# Patient Record
Sex: Male | Born: 1960 | Race: White | Hispanic: No | Smoking: Former smoker
Health system: Southern US, Community
[De-identification: ages and names within clinical notes are randomized; demographics above are authoritative.]

## PROBLEM LIST (undated history)

## (undated) DIAGNOSIS — T7840XA Allergy, unspecified, initial encounter: Secondary | ICD-10-CM

## (undated) DIAGNOSIS — I10 Essential (primary) hypertension: Secondary | ICD-10-CM

## (undated) DIAGNOSIS — F319 Bipolar disorder, unspecified: Secondary | ICD-10-CM

## (undated) DIAGNOSIS — R52 Pain, unspecified: Secondary | ICD-10-CM

## (undated) DIAGNOSIS — K219 Gastro-esophageal reflux disease without esophagitis: Secondary | ICD-10-CM

## (undated) DIAGNOSIS — I639 Cerebral infarction, unspecified: Secondary | ICD-10-CM

## (undated) DIAGNOSIS — F988 Other specified behavioral and emotional disorders with onset usually occurring in childhood and adolescence: Secondary | ICD-10-CM

## (undated) DIAGNOSIS — F32A Depression, unspecified: Secondary | ICD-10-CM

## (undated) DIAGNOSIS — M1990 Unspecified osteoarthritis, unspecified site: Secondary | ICD-10-CM

## (undated) DIAGNOSIS — F329 Major depressive disorder, single episode, unspecified: Secondary | ICD-10-CM

## (undated) DIAGNOSIS — G4730 Sleep apnea, unspecified: Secondary | ICD-10-CM

## (undated) DIAGNOSIS — F419 Anxiety disorder, unspecified: Secondary | ICD-10-CM

## (undated) DIAGNOSIS — I619 Nontraumatic intracerebral hemorrhage, unspecified: Secondary | ICD-10-CM

## (undated) DIAGNOSIS — N200 Calculus of kidney: Secondary | ICD-10-CM

## (undated) HISTORY — DX: Calculus of kidney: N20.0

## (undated) HISTORY — DX: Essential (primary) hypertension: I10

## (undated) HISTORY — DX: Cerebral infarction, unspecified: I63.9

## (undated) HISTORY — PX: COLONOSCOPY: SHX174

## (undated) HISTORY — PX: HERNIA REPAIR: SHX51

## (undated) HISTORY — DX: Allergy, unspecified, initial encounter: T78.40XA

---

## 2007-06-18 ENCOUNTER — Ambulatory Visit: Payer: Self-pay | Admitting: Specialist

## 2009-08-06 ENCOUNTER — Ambulatory Visit: Payer: Self-pay | Admitting: Gastroenterology

## 2010-11-01 ENCOUNTER — Ambulatory Visit: Payer: Self-pay | Admitting: Surgery

## 2011-10-10 DIAGNOSIS — F419 Anxiety disorder, unspecified: Secondary | ICD-10-CM | POA: Insufficient documentation

## 2011-10-10 DIAGNOSIS — I10 Essential (primary) hypertension: Secondary | ICD-10-CM | POA: Insufficient documentation

## 2011-10-10 DIAGNOSIS — J309 Allergic rhinitis, unspecified: Secondary | ICD-10-CM | POA: Insufficient documentation

## 2011-10-10 DIAGNOSIS — Z7251 High risk heterosexual behavior: Secondary | ICD-10-CM | POA: Insufficient documentation

## 2011-10-10 DIAGNOSIS — K219 Gastro-esophageal reflux disease without esophagitis: Secondary | ICD-10-CM | POA: Insufficient documentation

## 2011-10-10 DIAGNOSIS — F319 Bipolar disorder, unspecified: Secondary | ICD-10-CM | POA: Insufficient documentation

## 2012-10-26 DIAGNOSIS — I639 Cerebral infarction, unspecified: Secondary | ICD-10-CM | POA: Insufficient documentation

## 2012-10-31 ENCOUNTER — Inpatient Hospital Stay (HOSPITAL_COMMUNITY)
Admission: EM | Admit: 2012-10-31 | Discharge: 2012-11-02 | DRG: 810 | Disposition: A | Discharge status: Home / Self Care | Payer: BC Managed Care – PPO | Attending: Neurology | Admitting: Neurology

## 2012-10-31 ENCOUNTER — Emergency Department (HOSPITAL_COMMUNITY): Payer: BC Managed Care – PPO

## 2012-10-31 ENCOUNTER — Encounter (HOSPITAL_COMMUNITY): Payer: Self-pay | Admitting: Emergency Medicine

## 2012-10-31 DIAGNOSIS — Z800 Family history of malignant neoplasm of digestive organs: Secondary | ICD-10-CM

## 2012-10-31 DIAGNOSIS — F1920 Other psychoactive substance dependence, uncomplicated: Secondary | ICD-10-CM

## 2012-10-31 DIAGNOSIS — Z6830 Body mass index (BMI) 30.0-30.9, adult: Secondary | ICD-10-CM

## 2012-10-31 DIAGNOSIS — H53149 Visual discomfort, unspecified: Secondary | ICD-10-CM | POA: Diagnosis present

## 2012-10-31 DIAGNOSIS — I619 Nontraumatic intracerebral hemorrhage, unspecified: Secondary | ICD-10-CM | POA: Diagnosis present

## 2012-10-31 DIAGNOSIS — F17200 Nicotine dependence, unspecified, uncomplicated: Secondary | ICD-10-CM | POA: Diagnosis present

## 2012-10-31 DIAGNOSIS — G441 Vascular headache, not elsewhere classified: Secondary | ICD-10-CM

## 2012-10-31 DIAGNOSIS — I10 Essential (primary) hypertension: Secondary | ICD-10-CM | POA: Diagnosis present

## 2012-10-31 DIAGNOSIS — Z7982 Long term (current) use of aspirin: Secondary | ICD-10-CM

## 2012-10-31 DIAGNOSIS — F1010 Alcohol abuse, uncomplicated: Secondary | ICD-10-CM | POA: Diagnosis present

## 2012-10-31 DIAGNOSIS — F1120 Opioid dependence, uncomplicated: Secondary | ICD-10-CM | POA: Diagnosis present

## 2012-10-31 DIAGNOSIS — I609 Nontraumatic subarachnoid hemorrhage, unspecified: Principal | ICD-10-CM | POA: Diagnosis present

## 2012-10-31 DIAGNOSIS — I639 Cerebral infarction, unspecified: Secondary | ICD-10-CM

## 2012-10-31 DIAGNOSIS — I611 Nontraumatic intracerebral hemorrhage in hemisphere, cortical: Secondary | ICD-10-CM

## 2012-10-31 DIAGNOSIS — I498 Other specified cardiac arrhythmias: Secondary | ICD-10-CM | POA: Diagnosis present

## 2012-10-31 DIAGNOSIS — F411 Generalized anxiety disorder: Secondary | ICD-10-CM | POA: Diagnosis present

## 2012-10-31 HISTORY — DX: Anxiety disorder, unspecified: F41.9

## 2012-10-31 HISTORY — DX: Nontraumatic intracerebral hemorrhage, unspecified: I61.9

## 2012-10-31 LAB — COMPREHENSIVE METABOLIC PANEL
ALT: 26 U/L (ref 0–53)
Calcium: 9.1 mg/dL (ref 8.4–10.5)
Creatinine, Ser: 0.97 mg/dL (ref 0.50–1.35)
GFR calc Af Amer: >90 mL/min (ref >90)
Glucose, Bld: 119 mg/dL — ABNORMAL HIGH (ref 70–99)
Sodium: 134 mEq/L — ABNORMAL LOW (ref 135–145)
Total Protein: 7.2 g/dL (ref 6.0–8.3)

## 2012-10-31 LAB — PROTIME-INR
INR: 0.94 (ref 0.00–1.49)
Prothrombin Time: 12.4 seconds (ref 11.6–15.2)

## 2012-10-31 LAB — TYPE AND SCREEN
ABO/RH(D): A POS
Antibody Screen: NEGATIVE

## 2012-10-31 LAB — CBC
Hemoglobin: 15.2 g/dL (ref 13.0–17.0)
MCH: 29.6 pg (ref 26.0–34.0)
MCHC: 33.9 g/dL (ref 30.0–36.0)

## 2012-10-31 LAB — MRSA PCR SCREENING: MRSA by PCR: NEGATIVE

## 2012-10-31 MED ORDER — ACETAMINOPHEN 650 MG RE SUPP: 650.0000 mg | RECTAL | Status: DC | PRN

## 2012-10-31 MED ORDER — SODIUM CHLORIDE 0.9 % IV SOLN
INTRAVENOUS | Status: DC
  Administered 2012-10-31: 15:00:00 via INTRAVENOUS

## 2012-10-31 MED ORDER — IOHEXOL 350 MG/ML SOLN
100.0000 mL | Freq: Once | INTRAVENOUS | Status: AC | PRN
  Administered 2012-10-31: 100 mL via INTRAVENOUS

## 2012-10-31 MED ORDER — PANTOPRAZOLE SODIUM 40 MG PO TBEC
40.0000 mg | DELAYED_RELEASE_TABLET | Freq: Every day | ORAL | Status: DC
  Administered 2012-11-01 – 2012-11-02 (×2): 40 mg via ORAL
  Filled 2012-10-31 (×2): qty 1

## 2012-10-31 MED ORDER — ONDANSETRON HCL 4 MG/2ML IJ SOLN
4.0000 mg | Freq: Three times a day (TID) | INTRAMUSCULAR | Status: DC | PRN
  Administered 2012-10-31: 4 mg via INTRAVENOUS
  Filled 2012-10-31: qty 2

## 2012-10-31 MED ORDER — MORPHINE SULFATE 2 MG/ML IJ SOLN
2.0000 mg | INTRAMUSCULAR | Status: DC | PRN
  Administered 2012-10-31 – 2012-11-02 (×8): 2 mg via INTRAVENOUS
  Filled 2012-10-31 (×8): qty 1

## 2012-10-31 MED ORDER — PANTOPRAZOLE SODIUM 40 MG IV SOLR: 40.0000 mg | Freq: Every day | INTRAVENOUS | Status: DC

## 2012-10-31 MED ORDER — LORAZEPAM 2 MG/ML IJ SOLN: 0.5000 mg | Freq: Once | INTRAMUSCULAR | Status: AC

## 2012-10-31 MED ORDER — SENNOSIDES-DOCUSATE SODIUM 8.6-50 MG PO TABS
1.0000 | ORAL_TABLET | Freq: Two times a day (BID) | ORAL | Status: DC
  Administered 2012-11-01 – 2012-11-02 (×3): 1 via ORAL
  Filled 2012-10-31 (×5): qty 1

## 2012-10-31 MED ORDER — PANTOPRAZOLE SODIUM 40 MG PO TBEC: 40.0000 mg | DELAYED_RELEASE_TABLET | Freq: Every day | ORAL | Status: DC

## 2012-10-31 MED ORDER — LORAZEPAM 2 MG/ML IJ SOLN
0.5000 mg | Freq: Three times a day (TID) | INTRAMUSCULAR | Status: DC
  Administered 2012-11-01: 0.5 mg via INTRAMUSCULAR
  Filled 2012-10-31: qty 1

## 2012-10-31 MED ORDER — ACETAMINOPHEN 325 MG PO TABS
650.0000 mg | ORAL_TABLET | ORAL | Status: DC | PRN
  Administered 2012-11-01 – 2012-11-02 (×4): 650 mg via ORAL
  Filled 2012-10-31 (×4): qty 2

## 2012-10-31 MED ORDER — LORAZEPAM 2 MG/ML IJ SOLN
INTRAMUSCULAR | Status: AC
  Administered 2012-10-31: 0.5 mg via INTRAVENOUS
  Filled 2012-10-31: qty 1

## 2012-10-31 MED ORDER — PROMETHAZINE HCL 25 MG/ML IJ SOLN
25.0000 mg | Freq: Once | INTRAMUSCULAR | Status: AC
  Administered 2012-10-31: 25 mg via INTRAVENOUS
  Filled 2012-10-31: qty 1

## 2012-10-31 MED ORDER — ESCITALOPRAM OXALATE 10 MG PO TABS
10.0000 mg | ORAL_TABLET | Freq: Every day | ORAL | Status: DC
  Administered 2012-11-01 – 2012-11-02 (×2): 10 mg via ORAL
  Filled 2012-10-31 (×3): qty 1

## 2012-10-31 MED ORDER — HYDROMORPHONE HCL PF 1 MG/ML IJ SOLN
0.5000 mg | INTRAMUSCULAR | Status: DC | PRN
  Administered 2012-10-31 (×2): 0.5 mg via INTRAVENOUS
  Filled 2012-10-31: qty 1

## 2012-10-31 MED ORDER — MORPHINE SULFATE 2 MG/ML IJ SOLN
INTRAMUSCULAR | Status: AC
  Administered 2012-10-31: 2 mg via INTRAVENOUS
  Filled 2012-10-31: qty 1

## 2012-10-31 MED ORDER — LABETALOL HCL 5 MG/ML IV SOLN: 10.0000 mg | INTRAVENOUS | Status: DC | PRN

## 2012-10-31 MED ORDER — HYDROMORPHONE HCL PF 1 MG/ML IJ SOLN: 1.0000 mg | Freq: Once | INTRAMUSCULAR | Status: DC

## 2012-10-31 MED ORDER — HYDROMORPHONE HCL PF 1 MG/ML IJ SOLN
1.0000 mg | Freq: Once | INTRAMUSCULAR | Status: AC
  Administered 2012-10-31: 1 mg via INTRAVENOUS
  Filled 2012-10-31: qty 1

## 2012-10-31 MED ORDER — HYDROMORPHONE HCL PF 1 MG/ML IJ SOLN
1.0000 mg | INTRAMUSCULAR | Status: DC | PRN
  Filled 2012-10-31: qty 1

## 2012-10-31 MED ORDER — SODIUM CHLORIDE 0.9 % IV SOLN: INTRAVENOUS | Status: AC

## 2012-10-31 NOTE — ED Notes (Signed)
Patient had emesis.

## 2012-10-31 NOTE — ED Notes (Signed)
Patient has been  complaining of being hungry. Informed that he cannot have anything. Remains alert and oriented and follows commands. Pupils have been equal and reactive

## 2012-10-31 NOTE — ED Notes (Signed)
Pupils equal and reactive

## 2012-10-31 NOTE — H&P (Signed)
Admission H&P    Chief Complaint: Severe headache HPI: Roger [name] is an 52 y.o. male who developed a severe headache Tuesday night. He states the headache has been constant since then and has been associated with photophobia as well as nausea. The patient has been taking opioid pain medications without relief. He did not seek medical attention until today when he presented to the emergency department at [facility]. 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 There was a small amount of associated subarachnoid hemorrhage as well. A CT angiogram was also performed which showed no evidence of arteriovenous malformation although there was possibly a venous anomaly or venous angioma. The patient was transferred to [facility] to be admitted to the 3100 unit for further evaluation and treatment. He was reported to that a neurosurgeon at [facility] reviewed the patient's images and did not feel that surgery would be indicated. I do not have any actual documentation of a neurosurgery consult. The patient denied any associated visual changes, unilateral weakness, speech difficulties, or syncope.  The patient states that he was seen by his primary care physician Dr. Leavy Cella in [place] approximately 3 weeks ago. At that time the patient was told his blood pressure was high. Dr. Leavy Cella recommended treatment with an antihypertensive medication although the patient declined.  Past Medical History  Diagnosis Date  . Anxiety     Past Surgical History  Procedure Laterality Date  . Hernia repair    . Cholecystectomy      Family History: The patient denies any family history of strokes. Both of his parents are in their 68s. His father is alive and well. His mother is dying from pancreatic cancer.   Social History:  reports that he has been smoking approximately 1/2 pack cigarettes per day for 10 years.  He does not  have any smokeless tobacco history on file. He reports that  drinks alcohol. He reports that he uses illicit drugs including cocaine although he has not used any recently. The patient works as a Scientist, research (medical).  Allergies: No Known Allergies  Medications: Medications Prior to Admission  Medication Sig Dispense Refill  . ALPRAZolam (XANAX) 1 MG tablet Take 0.5 mg by mouth 2 (two) times daily as needed for sleep or anxiety.       Marland Kitchen aspirin-acetaminophen-caffeine (EXCEDRIN MIGRAINE) 250-250-65 MG per tablet Take 1 tablet by mouth every 8 (eight) hours as needed (for migraine).      . butalbital-acetaminophen-caffeine (FIORICET, ESGIC) 50-325-40 MG per tablet Take 1 tablet by mouth once as needed for headache.      . calcium carbonate (TUMS - DOSED IN MG ELEMENTAL CALCIUM) 500 MG chewable tablet Chew 1 tablet by mouth daily as needed for heartburn.      . escitalopram (LEXAPRO) 10 MG tablet Take 10 mg by mouth daily.      Marland Kitchen ketorolac (TORADOL) 10 MG tablet Take 20 mg by mouth daily as needed for pain.      . Multiple Vitamins-Minerals (MULTIVITAMIN PO) Take 1 tablet by mouth daily.      Marland Kitchen omeprazole (PRILOSEC) 40 MG capsule Take 40 mg by mouth daily.      . traZODone (DESYREL) 50 MG tablet Take 50 mg by mouth at bedtime.      . naproxen sodium (ANAPROX) 220 MG tablet Take 440 mg by mouth 2 (two) times daily as needed (for pain).  ROS: Please see the history of present illness as noted above. The patient denies any other recent medical symptoms. He is somewhat uncooperative with the history at this time due to pain.  Physical Examination: Blood pressure 125/72, pulse 47, temperature 98.3 F (36.8 C), temperature source Oral, resp. rate 16, height 6' (1.829 m), weight 100.4 kg (221 lb 5.5 oz), SpO2 97.00%.  General Examination:     General - 52 year old male in bed holding his head complaining of a severe headache. Heart - Regular rate and rhythm - no murmer - bradycardia rate. Lungs -  Clear to auscultation Abdomen - Soft - non tender Extremities - Distal pulses intact - no edema Skin - Warm and dry  Mental Status: Alert, oriented, thought content appropriate.  Speech fluent without evidence of aphasia.  Able to follow 3 step commands without difficulty. Cranial Nerves: II: Discs unable to visualize; Visual fields grossly normal, pupils equal, round, reactive to light and accommodation III,IV, VI: ptosis not present, extra-ocular motions intact bilaterally V,VII: smile symmetric, facial light touch sensation normal bilaterally VIII: hearing normal bilaterally IX,X: gag reflex present XI: bilateral shoulder shrug XII: midline tongue extension Motor: Right : Upper extremity   5/5    Left:     Upper extremity   5/5  Lower extremity   5/5     Lower extremity   5/5 Tone and bulk:normal tone throughout; no atrophy noted Sensory: Light touch intact throughout, bilaterally Deep Tendon Reflexes: 2+ and symmetric throughout Plantars: Right: downgoing   Left: downgoing Cerebellar: normal finger-to-nose, normal rapid alternating movements  Gait: Did not attempt to ambulate the patient due to safety concerns. CV: pulses palpable throughout   Laboratory Studies: Basic Metabolic Panel:  Recent Labs Lab 10/31/12 1400  NA 134*  K 4.2  CL 101  CO2 25  GLUCOSE 119*  BUN 10  CREATININE 0.97  CALCIUM 9.1    Liver Function Tests:  Recent Labs Lab 10/31/12 1400  AST 18  ALT 26  ALKPHOS 53  BILITOT 0.4  PROT 7.2  ALBUMIN 3.8   No results found for this basename: LIPASE, AMYLASE,  in the last 168 hours No results found for this basename: AMMONIA,  in the last 168 hours  CBC:  Recent Labs Lab 10/31/12 1400  WBC 13.3*  HGB 15.2  HCT 44.9  MCV 87.5  PLT 199    Cardiac Enzymes: No results found for this basename: CKTOTAL, CKMB, CKMBINDEX, TROPONINI,  in the last 168 hours  BNP: No components found with this basename: POCBNP,   CBG: No results found  for this basename: GLUCAP,  in the last 168 hours  Microbiology: No results found for this or any previous visit.  Coagulation Studies:  Recent Labs  10/31/12 1400  LABPROT 12.4  INR 0.94    Urinalysis: No results found for this basename: COLORURINE, APPERANCEUR, LABSPEC, PHURINE, GLUCOSEU, HGBUR, BILIRUBINUR, KETONESUR, PROTEINUR, UROBILINOGEN, NITRITE, LEUKOCYTESUR,  in the last 168 hours  Lipid Panel: No results found for this basename: chol, trig, hdl, cholhdl, vldl, ldlcalc    HgbA1C: No results found for this basename: HGBA1C    Urine Drug Screen:   No results found for this basename: labopia, cocainscrnur, labbenz, amphetmu, thcu, labbarb     Alcohol Level: No results found for this basename: ETH,  in the last 168 hours  Other results: EKG: An EKG currently not available. We will order one at this time.  Imaging:  Ct Angio Head W/cm &/or Wo Cm 10/31/2012  No evidence of high flow arteriovenous malformation.  Grouping of slightly prominent spider-like vessels along the lateral margin of the right frontal hemorrhage raising the possibility of a venous anomaly/venous angioma.  I do not believe this is absolutely certain however.    Ct Head Wo Contrast 10/31/2012  3.2 x 2.4 x 3.0 cm intraparenchymal hemorrhage in the right frontal lobe.  There is a small amount of associated subarachnoid hemorrhage.  I called this report to the in the emergency room health care provider. .    Assessment/Plan This is an unfortunate 52 year old male admitted to to the neuro intensive care unit with an intraparenchymal hemorrhage in the right frontal lobe with subarachnoid extension. The patient continues to have severe pain despite intravenous dilaudid.. As discussed with Dr. Roseanne Reno we will try intravenous morphine for pain. The patient also has a history of an anxiety disorder. We will place him on scheduled Ativan for the time being. We will plan on repeating a CT scan in the morning  for further evaluation. The patient is also noted to be bradycardic despite his pain. He denies any previous history of bradycardia. We will check an EKG and closely monitor his heart rate.  Delton See PA-C [facility] Pager [phone] 10/31/2012, 5:46 PM  I personally participated in this patient's evaluation and management including bedside evaluation, as well as formulation of the above clinical assessment and management recommendations.  Venetia Maxon M.D. [facility] [phone]

## 2012-10-31 NOTE — ED Notes (Signed)
Pt c/o r side headache x5 days.  Denies hx of migraines, LOC, confusion, or blurred vision.  Reports taking multiple pain medications with no relief.

## 2012-10-31 NOTE — ED Provider Notes (Signed)
History    CSN: [id number] Arrival date & time 10/31/12  1048  First MD Initiated Contact with Patient 10/31/12 1213     Chief Complaint  Patient presents with  . Migraine   (Consider location/radiation/quality/duration/timing/severity/associated sxs/prior Treatment) HPI Pt is a 52yo male presenting with gradual onset of new headache that started on Tuesday 6/24, 10/10, right side of his head associated with photophobia, phonophobia, and nausea.  Has tried "multiple" OTC medications w/o relief.  Denies hx of migraines, LOC, confusion, or blurred vision.  Denies any significant medical hx.  Does not take any daily medications. Denies head trauma.  Past Medical History  Diagnosis Date  . Anxiety    Past Surgical History  Procedure Laterality Date  . Hernia repair    . Cholecystectomy     No family history on file. History  Substance Use Topics  . Smoking status: Current Every Day Smoker  . Smokeless tobacco: Not on file  . Alcohol Use: Yes    Review of Systems  Constitutional: Negative for fever, chills and fatigue.  Eyes: Positive for photophobia. Negative for pain, redness and visual disturbance.  Gastrointestinal: Positive for nausea.  Neurological: Positive for headaches. Negative for dizziness and light-headedness.  All other systems reviewed and are negative.    Allergies  Review of patient's allergies indicates no known allergies.  Home Medications   No current outpatient prescriptions on file. BP 125/72  Pulse 47  Temp(Src) 98.3 F (36.8 C) (Oral)  Resp 16  Ht 6' (1.829 m)  Wt 221 lb 5.5 oz (100.4 kg)  BMI 30.01 kg/m2  SpO2 97% Physical Exam  Nursing note and vitals reviewed. Constitutional: He is oriented to person, place, and time. He appears well-developed and well-nourished.  Pt lying on exam bed with hands and pillow over right side of his head, lights turned out. Appears in moderate discomfort due to head pain.   HENT:  Head: Normocephalic  and atraumatic.  Eyes: Conjunctivae are normal. No scleral icterus.  Neck: Normal range of motion. Neck supple.  No nuchal rigidity or meningeal signs.    Cardiovascular: Normal rate, regular rhythm and normal heart sounds.   Pulmonary/Chest: Effort normal and breath sounds normal.  Musculoskeletal: Normal range of motion.  Neurological: He is alert and oriented to person, place, and time. He has normal strength. No cranial nerve deficit or sensory deficit. Coordination normal. GCS eye subscore is 4. GCS verbal subscore is 5. GCS motor subscore is 6.  CN II-XII in tact, no focal deficit, nl finger to nose coordination. Nl sensation, 5/5 strength in all major muscle groups. Did not have pt stand due to severe pain and discomfort.   Skin: Skin is warm and dry. He is not diaphoretic.    ED Course  Procedures (including critical care time) Labs Reviewed  CBC - Abnormal; Notable for the following:    WBC 13.3 (*)    All other components within normal limits  COMPREHENSIVE METABOLIC PANEL - Abnormal; Notable for the following:    Sodium 134 (*)    Glucose, Bld 119 (*)    All other components within normal limits  MRSA PCR SCREENING  PROTIME-INR  TYPE AND SCREEN  ABO/RH   Ct Angio Head W/cm &/or Wo Cm  10/31/2012   *RADIOLOGY REPORT*  Clinical Data:  Right frontal intraparenchymal hematoma.  CT ANGIOGRAPHY HEAD  Technique:  Multidetector CT imaging of the head was performed using the standard protocol during bolus administration of intravenous contrast.  Multiplanar  CT image reconstructions including MIPs were obtained to evaluate the vascular anatomy.  Contrast:   100 ml Omnipaque-300  Comparison:  Head CT same day  Findings:  There is no enlargement of the right frontal hematoma measuring 3.1 x 2.4 cm.  Surrounding edema appears the same.  No significant mass effect or shift.  There is no evidence of high flow arteriovenous malformation. Along the lateral margin of the right frontal  hemorrhage, there are some slightly prominent spider-like vessels which appear to be venous.  This could possibly indicate the presence of a low-level venous malformation, though I do not think we have established that with certainty.  This could be a luxury perfusion phenomenon.  Both internal carotid arteries are widely patent through the siphon regions.  No stenosis.  The anterior and middle cerebral vessels are normal without proximal stenosis or aneurysm.  Both vertebral arteries are patent to the basilar.  No basilar stenosis. Posterior circulation branch vessels are normal.  No evidence of venous thrombosis   Review of the MIP images confirms the above findings.  IMPRESSION: No evidence of high flow arteriovenous malformation.  Grouping of slightly prominent spider-like vessels along the lateral margin of the right frontal hemorrhage raising the possibility of a venous anomaly/venous angioma.  I do not believe this is absolutely certain however.   Original Report Authenticated By: Paulina Fusi, M.D.   Ct Head Wo Contrast  10/31/2012   *RADIOLOGY REPORT*  Clinical Data: Right-sided headache for 5 days.  CT HEAD WITHOUT CONTRAST  Technique:  Contiguous axial images were obtained from the base of the skull through the vertex without contrast.  Comparison: None.  Findings: There is a 3.2 x 2.4 x 3.0 cm intraparenchymal hemorrhage in the white matter of the right frontal lobe.  This hemorrhages surrounded by a small rim of edema and is not producing any mass effect.  There is a small amount of overlying subarachnoid hemorrhage.  There is no additional evidence of intracranial subdural  or subarachnoid hemorrhage.  There is no evidence of infarction or shift of midline structures.  The white and gray matter compartments are otherwise normal.  The ventricles and extra- axial spaces are normal.  The bony calvarium is intact.  The skull base is normal.  The mastoid air cells are clear.  Orbits and paranasal sinuses  are normal.  IMPRESSION: 3.2 x 2.4 x 3.0 cm intraparenchymal hemorrhage in the right frontal lobe.  There is a small amount of associated subarachnoid hemorrhage.  I called this report to the in the emergency room health care provider.   Original Report Authenticated By: Sander Radon, M.D.     Date: 10/31/2012  Rate: 66  Rhythm: junctional tachycardia  QRS Axis: indeterminate  Intervals: normal  ST/T Wave abnormalities: ST depressions inferiorly and borderline  Conduction Disutrbances:none  Narrative Interpretation:   Old EKG Reviewed: unchanged    1. Intraparenchymal hemorrhage of brain   2. Subarachnoid hemorrhage     MDM  Concern for intracranial bleed including SAH, brain mass, atypical migraine.  Pt does not have hx of migraines, denies head trauma, pt is overall healthy, denies use of blood thinners, source of severe HA unknown.  Will obtain CT head to help determine cause of pt's new onset headache.  Tx pain with Dilaudid and phenergan.   5:19 PM Informed by CT, pt has right frontal hematoma.  Will discuss with Dr. Denton Lank for further testing and consult with neurosurgery.  CT head: 3.2 x 2.4 x 3.0cm  intraparenchymal hemorrhage in right frontal lobe.  Small amount of associated SAH.    Consulted Dr. Dian Queen, neurosurgery who believes pt is appropriate for stroke service.  Consulted Dr. Elinor Parkinson who agreed to take over care of pt and have him transferred to [facility].   Junius Finner, PA-C 10/31/12 1719

## 2012-11-01 ENCOUNTER — Encounter (HOSPITAL_COMMUNITY): Payer: Self-pay | Admitting: Radiology

## 2012-11-01 ENCOUNTER — Inpatient Hospital Stay (HOSPITAL_COMMUNITY): Payer: BC Managed Care – PPO

## 2012-11-01 DIAGNOSIS — I6350 Cerebral infarction due to unspecified occlusion or stenosis of unspecified cerebral artery: Secondary | ICD-10-CM

## 2012-11-01 LAB — CBC
MCH: 29.8 pg (ref 26.0–34.0)
MCV: 87.6 fL (ref 78.0–100.0)
Platelets: 179 10*3/uL (ref 150–400)
RBC: 4.9 MIL/uL (ref 4.22–5.81)
RDW: 12.8 % (ref 11.5–15.5)

## 2012-11-01 LAB — RAPID URINE DRUG SCREEN, HOSP PERFORMED
Barbiturates: POSITIVE — AB
Benzodiazepines: POSITIVE — AB
Tetrahydrocannabinol: POSITIVE — AB

## 2012-11-01 LAB — HIV ANTIBODY (ROUTINE TESTING W REFLEX): HIV: NONREACTIVE

## 2012-11-01 LAB — BASIC METABOLIC PANEL
CO2: 27 mEq/L (ref 19–32)
Calcium: 8.5 mg/dL (ref 8.4–10.5)
Creatinine, Ser: 0.92 mg/dL (ref 0.50–1.35)

## 2012-11-01 MED ORDER — ONDANSETRON HCL 4 MG/2ML IJ SOLN
4.0000 mg | Freq: Four times a day (QID) | INTRAMUSCULAR | Status: DC | PRN
  Administered 2012-11-01 – 2012-11-02 (×2): 4 mg via INTRAVENOUS
  Filled 2012-11-01 (×2): qty 2

## 2012-11-01 MED ORDER — GADOBENATE DIMEGLUMINE 529 MG/ML IV SOLN
20.0000 mL | Freq: Once | INTRAVENOUS | Status: AC
  Administered 2012-11-01: 20 mL via INTRAVENOUS

## 2012-11-01 MED ORDER — LORAZEPAM 0.5 MG PO TABS
0.5000 mg | ORAL_TABLET | Freq: Two times a day (BID) | ORAL | Status: DC
  Administered 2012-11-01 – 2012-11-02 (×3): 0.5 mg via ORAL
  Filled 2012-11-01 (×3): qty 1

## 2012-11-01 MED ORDER — LORAZEPAM 0.5 MG PO TABS: 0.5000 mg | ORAL_TABLET | Freq: Two times a day (BID) | ORAL | Status: DC

## 2012-11-01 NOTE — ED Provider Notes (Signed)
Medical screening examination/treatment/procedure(s) were conducted as a shared visit with non-physician practitioner(s) and myself.  I personally evaluated the patient during the encounter Pt c/o acute onset diffuse headache 5 days ago, constant headache since onset. Ct w intraparencymal hem/small amt sah, discussed w Dr Gerlene Fee.  He request CTA and transfer to stroke service at [facility].  Discussed w Dr Roseanne Reno - he accepts in transfer to [facility].    Suzi Roots, MD 11/01/12 [phone]

## 2012-11-01 NOTE — Progress Notes (Signed)
Stroke Team Progress Note  HISTORY Roger [name] is an 52 y.o. male who developed a severe headache Tuesday night 10/26/2012. He states the headache has been constant since then and has been associated with photophobia as well as nausea. The patient has been taking opioid pain medications without relief. He did not seek medical attention until today 6/29/2014when he presented to the emergency department at [facility]. 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 There was a small amount of associated subarachnoid hemorrhage as well. A CT angiogram was also performed which showed no evidence of arteriovenous malformation although there was possibly a venous anomaly or venous angioma. The patient was transferred to [facility] to be admitted to the 3100 unit for further evaluation and treatment. He was reported to that a neurosurgeon at [facility] reviewed the patient's images and did not feel that surgery would be indicated. I do not have any actual documentation of a neurosurgery consult. The patient denied any associated visual changes, unilateral weakness, speech difficulties, or syncope.   The patient states that he was seen by his primary care physician Dr. Leavy Cella in [place] approximately 3 weeks ago. At that time the patient was told his blood pressure was high. Dr. Leavy Cella recommended treatment with an antihypertensive medication although the patient declined.  SUBJECTIVE His family is at the bedside.  Overall he feels his condition is stable. He complains of headache.  OBJECTIVE Most recent Vital Signs: Filed Vitals:   11/01/12 0400 11/01/12 0500 11/01/12 0600 11/01/12 0700  BP: 86/39 146/121 153/81 120/59  Pulse: 45 45 44 46  Temp:      TempSrc:      Resp: 22 21 22 16   Height:      Weight:      SpO2: 93% 95% 94% 96%   CBG (last 3)  No results found for this basename: GLUCAP,  in the last 72  hours  IV Fluid Intake:   . sodium chloride 10 mL/hr at 10/31/12 2000    MEDICATIONS  . sodium chloride   Intravenous STAT  . escitalopram  10 mg Oral Daily  .  HYDROmorphone (DILAUDID) injection  1 mg Intravenous Once  . LORazepam  0.5 mg Intramuscular Q8H  . pantoprazole  40 mg Oral Daily  . senna-docusate  1 tablet Oral BID   PRN:  acetaminophen, acetaminophen, labetalol, morphine injection  Diet:  Sodium Restricted thin liquids Activity:  Bedrest DVT Prophylaxis:  SCDs   CLINICALLY SIGNIFICANT STUDIES Basic Metabolic Panel:  Recent Labs Lab 10/31/12 1400 11/01/12 0530  NA 134* 134*  K 4.2 4.2  CL 101 101  CO2 25 27  GLUCOSE 119* 118*  BUN 10 12  CREATININE 0.97 0.92  CALCIUM 9.1 8.5   Liver Function Tests:  Recent Labs Lab 10/31/12 1400  AST 18  ALT 26  ALKPHOS 53  BILITOT 0.4  PROT 7.2  ALBUMIN 3.8   CBC:  Recent Labs Lab 10/31/12 1400 11/01/12 0530  WBC 13.3* 14.0*  HGB 15.2 14.6  HCT 44.9 42.9  MCV 87.5 87.6  PLT 199 179   Coagulation:  Recent Labs Lab 10/31/12 1400  LABPROT 12.4  INR 0.94   Cardiac Enzymes: No results found for this basename: CKTOTAL, CKMB, CKMBINDEX, TROPONINI,  in the last 168 hours Urinalysis: No results found for this basename: COLORURINE, APPERANCEUR, LABSPEC, PHURINE, GLUCOSEU, HGBUR, BILIRUBINUR, KETONESUR, PROTEINUR, UROBILINOGEN, NITRITE, LEUKOCYTESUR,  in the  last 168 hours Lipid Panel No results found for this basename: chol, trig, hdl, cholhdl, vldl, ldlcalc   HgbA1C  No results found for this basename: HGBA1C    Urine Drug Screen:   No results found for this basename: labopia, cocainscrnur, labbenz, amphetmu, thcu, labbarb    Alcohol Level: No results found for this basename: ETH,  in the last 168 hours  Ct Angio Head 10/31/2012  No evidence of high flow arteriovenous malformation.  Grouping of slightly prominent spider-like vessels along the lateral margin of the right frontal hemorrhage raising the  possibility of a venous anomaly/venous angioma.  I do not believe this is absolutely certain however.   CT of the brain   11/01/2012    1.  Stable appearance of the right anterior frontal lobe parenchymal hemorrhage with slight subarachnoid extension. 2.  No new hemorrhage or significant interval change.  10/31/2012   3.2 x 2.4 x 3.0 cm intraparenchymal hemorrhage in the right frontal lobe.  There is a small amount of associated subarachnoid hemorrhage.    MRI of the brain    MRA of the brain    2D Echocardiogram    Carotid Doppler    CXR    EKG     Therapy Recommendations   Physical Exam  General: The patient is alert and cooperative at the time of the examination.  Respiratory: Lung fields are clear.  Abdomen: Abdomen is soft, nontender, positive bowel sounds  Skin: No significant peripheral edema is noted.   Neurologic Exam  Cranial nerves: Facial symmetry is present. Speech is normal, no aphasia or dysarthria is noted. Extraocular movements are full. Visual fields are full.  Motor: The patient has good strength in all 4 extremities.  Coordination: The patient has good finger-nose-finger and heel-to-shin bilaterally.  Gait and station: The gait was not tested. No drift is seen.  Reflexes: Deep tendon reflexes are symmetric.     ASSESSMENT Mr. Roger [name] is a 52 y.o. male presenting with severe headache, photophobia and nausea. Imaging confirms a right frontal lobe IPH and SAH, ? Venous  anomoly. Etiology of hemorrhage uncertain at this point, BP not extremely elevated on admission. On aspirin sporatically prior to admission. Patient with resultant headache, no other neuro deficits. Work up underway.   Cigarette smoker, 1/2 ppd  etoh use, daily  Marijuana use  Hx cocaine use, none recent Hypertension, pt refused medications recommended by primary MD within the past 1 week  Hospital day # 1  TREATMENT/PLAN  Transfer to the floor  Change meds to  po  Check MRI with and without, carotid doppler, 2D echo  Check UDS  F/u HIV  OOB. Therapy evals  SBP goal < 180  SHARON BIBY, MSN, RN, ANVP-BC, ANP-BC, GNP-BC [facility] Pager: [phone] 11/01/2012 9:04 AM  I have personally obtained a history, examined the patient, evaluated imaging results, and formulated the assessment and plan of care. I agree with the above. Lesly Dukes

## 2012-11-01 NOTE — Progress Notes (Signed)
UR COMPLETED  

## 2012-11-01 NOTE — Evaluation (Signed)
Physical Therapy Evaluation Patient Details Name: Roger [name] MRN: [id number] DOB: 10-09-1960 Today's Date: 11/01/2012 Time: 4098-1191 PT Time Calculation (min): 12 min  PT Assessment / Plan / Recommendation History of Present Illness  Patient is a 52 y/o male admitted with severe headache and photophobia and nausea.  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 There was a small amount of associated subarachnoid hemorrhage as well.  Clinical Impression  Patient presents with decreased mobility limited by pain, decreased balance and limited activity tolerance and will benefit from skilled PT in the acute setting to allow return to independent.  Will initially need 24 hour assist upon d/c and will need further assessment to determine further discharge needs due to limited assessment with severe pain upon standing and walking in the room.    PT Assessment  Patient needs continued PT services    Follow Up Recommendations  Home health PT;Supervision/Assistance - 24 hour;No PT follow up (depending on progress)          Equipment Recommendations  Other (comment) (to be assessed)       Frequency Min 3X/week    Precautions / Restrictions Precautions Precautions: Fall   Pertinent Vitals/Pain 10/10 head pain with mobility      Mobility  Bed Mobility Bed Mobility: Right Sidelying to Sit;Sit to Supine Right Sidelying to Sit: 4: Min guard;HOB flat Sit to Supine: 4: Min guard;HOB flat Details for Bed Mobility Assistance: impulsively getting up due to frustration with immobility and pain and wanting to get up  Transfers Transfers: Sit to Stand;Stand to Sit Sit to Stand: 5: Supervision;From bed Stand to Sit: 5: Supervision;To bed Details for Transfer Assistance: holding head throughout Ambulation/Gait Ambulation/Gait Assistance: 4: Min assist Ambulation Distance (Feet): 4 Feet Ambulation/Gait Assistance Details: limited  mobility in room, pt attempted, but unable to tolerate due to increased head pain to 10/10 with attempted ambulation Gait Pattern: Decreased stride length;Step-through pattern;Trunk flexed;Wide base of support Modified Rankin (Stroke Patients Only) Pre-Morbid Rankin Score: No symptoms Modified Rankin: Moderately severe disability        PT Diagnosis: Difficulty walking;Acute pain  PT Problem List: Decreased mobility;Decreased balance;Decreased activity tolerance;Decreased safety awareness PT Treatment Interventions: DME instruction;Balance training;Gait training;Stair training;Functional mobility training;Therapeutic activities;Therapeutic exercise;Patient/family education     PT Goals(Current goals can be found in the care plan section) Acute Rehab PT Goals PT Goal Formulation: With patient Time For Goal Achievement: 11/05/12 Potential to Achieve Goals: Good  Visit Information  Last PT Received On: 11/01/12 Assistance Needed: +1 History of Present Illness: Patient is a 52 y/o male admitted with severe headache and photophobia and nausea.  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 There was a small amount of associated subarachnoid hemorrhage as well.       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 Dominant Hand: Right    Cognition  Cognition Arousal/Alertness: Awake/alert Behavior During Therapy: WFL for tasks assessed/performed Overall Cognitive Status:  Within Functional Limits for tasks assessed    Extremity/Trunk Assessment Lower Extremity Assessment Lower Extremity  Assessment: Overall WFL for tasks assessed   Balance Balance Balance Assessed: Yes Static Sitting Balance Static Sitting - Balance Support: Feet supported Static Sitting - Level of Assistance: 5: Stand by assistance  End of Session PT - End of Session Equipment Utilized During Treatment: Gait belt Activity Tolerance: Patient limited by pain Patient left: in bed;with call bell/phone within reach;with family/visitor present  GP     [facility] 11/01/2012, 5:27 PM Sheran Lawless, PT [phone] 11/01/2012

## 2012-11-01 NOTE — Progress Notes (Signed)
SLP Cancellation Note  Patient Details Name: Roger [name] MRN: [id number] DOB: [birth year]   Cancelled treatment:       Reason Eval/Treat Not Completed: Pain limiting ability to participate. Pt c/o pain, waiting for meds. Politely requested SLP return for cognitive linguistic eval.    Ching Rabideau, Riley Nearing 11/01/2012, 9:38 AM

## 2012-11-01 NOTE — Progress Notes (Signed)
  Echocardiogram 2D Echocardiogram has been performed.  Roger [name] 11/01/2012, 3:36 PM

## 2012-11-01 NOTE — Progress Notes (Signed)
  1500 Received patient from MRI, alert, and oriented. compliants of severe headache, at  Present not wanting anything for it.  Settled in room, telemetery applied, pt oriented to room, need to call for assistance for getting up, then requesting pain med,  Something to eat.  Medicated with IV morphine, given ice cream and crackers per patient request.   Then  Had episode of dry heaves medicated with IV zofran.  Also medicated with po Tylenol for headache,  pts partner at bedside.

## 2012-11-01 NOTE — Progress Notes (Signed)
VASCULAR LAB PRELIMINARY  PRELIMINARY  PRELIMINARY  PRELIMINARY  Carotid duplex  completed.    Preliminary report:  Bilateral:  Less than 39% ICA stenosis.  Vertebral artery flow is antegrade.      Kayven Aldaco, RVT 11/01/2012, 11:19 AM

## 2012-11-02 ENCOUNTER — Encounter: Payer: Self-pay | Admitting: Nurse Practitioner

## 2012-11-02 ENCOUNTER — Telehealth: Payer: Self-pay

## 2012-11-02 DIAGNOSIS — F1120 Opioid dependence, uncomplicated: Secondary | ICD-10-CM

## 2012-11-02 DIAGNOSIS — I609 Nontraumatic subarachnoid hemorrhage, unspecified: Principal | ICD-10-CM

## 2012-11-02 DIAGNOSIS — I611 Nontraumatic intracerebral hemorrhage in hemisphere, cortical: Secondary | ICD-10-CM

## 2012-11-02 DIAGNOSIS — G441 Vascular headache, not elsewhere classified: Secondary | ICD-10-CM

## 2012-11-02 DIAGNOSIS — I619 Nontraumatic intracerebral hemorrhage, unspecified: Secondary | ICD-10-CM

## 2012-11-02 NOTE — Progress Notes (Signed)
Pt still not falling instructions on calling for assistance to get up, bed alarm is off.  Loreen Bankson [employer]

## 2012-11-02 NOTE — Progress Notes (Signed)
Occupational Therapy Discharge Patient Details Name: Roger [name] MRN: [id number] DOB: 03-May-1961 Today's Date: 11/02/2012 Time: 6578-4696 OT Time Calculation (min): 23 min  Patient discharged from OT services secondary to goals met and no further OT needs identified.  Please see latest therapy progress note for current level of functioning and progress toward goals.    Progress and discharge plan discussed with patient and/or caregiver: Patient/Caregiver agrees with plan  GO     Lucile Shutters Pager: [phone]  11/02/2012, 12:00 PM

## 2012-11-02 NOTE — Progress Notes (Signed)
Stroke Team Progress Note  HISTORY Roger [name] is an 52 y.o. male who developed a severe headache Tuesday night 10/26/2012. He states the headache has been constant since then and has been associated with photophobia as well as nausea. The patient has been taking opioid pain medications without relief. He did not seek medical attention until today 6/29/2014when he presented to the emergency department at [facility]. 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 There was a small amount of associated subarachnoid hemorrhage as well. A CT angiogram was also performed which showed no evidence of arteriovenous malformation although there was possibly a venous anomaly or venous angioma. The patient was transferred to [facility] to be admitted to the 3100 unit for further evaluation and treatment. He was reported to that a neurosurgeon at [facility] reviewed the patient's images and did not feel that surgery would be indicated. I do not have any actual documentation of a neurosurgery consult. The patient denied any associated visual changes, unilateral weakness, speech difficulties, or syncope.   The patient states that he was seen by his primary care physician Dr. Leavy Cella in [place] approximately 3 weeks ago. At that time the patient was told his blood pressure was high. Dr. Leavy Cella recommended treatment with an antihypertensive medication although the patient declined.  SUBJECTIVE Patient and his friend are together in the bed. Patient is complaining of photophobia.  OBJECTIVE Most recent Vital Signs: Filed Vitals:   11/01/12 2136 11/02/12 0109 11/02/12 0512 11/02/12 1000  BP: 140/76 135/92 145/83 159/83  Pulse: 48 48 46 55  Temp: 98.2 F (36.8 C) 97.5 F (36.4 C) 98.1 F (36.7 C) 97.9 F (36.6 C)  TempSrc: Oral Oral Oral Oral  Resp: 18 18 16 20   Height:      Weight:      SpO2: 99% 99% 98% 100%   CBG  (last 3)  No results found for this basename: GLUCAP,  in the last 72 hours  IV Fluid Intake:      MEDICATIONS  . escitalopram  10 mg Oral Daily  . LORazepam  0.5 mg Oral BID  . pantoprazole  40 mg Oral Daily  . senna-docusate  1 tablet Oral BID   PRN:  acetaminophen, acetaminophen, labetalol, morphine injection, ondansetron  Diet:  Sodium Restricted thin liquids Activity:  OOB DVT Prophylaxis:  SCDs   CLINICALLY SIGNIFICANT STUDIES Basic Metabolic Panel:   Recent Labs Lab 10/31/12 1400 11/01/12 0530  NA 134* 134*  K 4.2 4.2  CL 101 101  CO2 25 27  GLUCOSE 119* 118*  BUN 10 12  CREATININE 0.97 0.92  CALCIUM 9.1 8.5   Liver Function Tests:   Recent Labs Lab 10/31/12 1400  AST 18  ALT 26  ALKPHOS 53  BILITOT 0.4  PROT 7.2  ALBUMIN 3.8   CBC:   Recent Labs Lab 10/31/12 1400 11/01/12 0530  WBC 13.3* 14.0*  HGB 15.2 14.6  HCT 44.9 42.9  MCV 87.5 87.6  PLT 199 179   Coagulation:   Recent Labs Lab 10/31/12 1400  LABPROT 12.4  INR 0.94   Cardiac Enzymes: No results found for this basename: CKTOTAL, CKMB, CKMBINDEX, TROPONINI,  in the last 168 hours Urinalysis: No results found for this basename: COLORURINE, APPERANCEUR, LABSPEC, PHURINE, GLUCOSEU, HGBUR, BILIRUBINUR, KETONESUR, PROTEINUR, UROBILINOGEN, NITRITE, LEUKOCYTESUR,  in the last 168 hours Lipid Panel No results found for this basename: chol,  trig,  hdl,  cholhdl,  vldl,  ldlcalc   HgbA1C  No results found for this basename: HGBA1C   Drugs of Abuse     Component Value Date/Time   LABOPIA POSITIVE* 11/01/2012 1210   COCAINSCRNUR NONE DETECTED 11/01/2012 1210   LABBENZ POSITIVE* 11/01/2012 1210   AMPHETMU NONE DETECTED 11/01/2012 1210   THCU POSITIVE* 11/01/2012 1210   LABBARB POSITIVE* 11/01/2012 1210     Alcohol Level: No results found for this basename: ETH,  in the last 168 hours  Ct Angio Head 10/31/2012  No evidence of high flow arteriovenous malformation.  Grouping of slightly  prominent spider-like vessels along the lateral margin of the right frontal hemorrhage raising the possibility of a venous anomaly/venous angioma.  I do not believe this is absolutely certain however.   CT of the brain   11/01/2012    1.  Stable appearance of the right anterior frontal lobe parenchymal hemorrhage with slight subarachnoid extension. 2.  No new hemorrhage or significant interval change.  10/31/2012   3.2 x 2.4 x 3.0 cm intraparenchymal hemorrhage in the right frontal lobe.  There is a small amount of associated subarachnoid hemorrhage.    MRI of the brain  11/01/2012 Right frontal lobe hematoma is unchanged. No underlying mass or vascular malformation is identified. This hematoma could be caused by hypertension, cerebral amyloid, or trauma.  MRA of the brain  See CT angio head  2D Echocardiogram  EF 65-70% with no source of embolus.   Carotid Doppler  No evidence of hemodynamically significant internal carotid artery stenosis. Vertebral artery flow is antegrade.   Therapy Recommendations no OT, home health PT  Physical Exam  General: The patient is alert and cooperative at the time of the examination.  Respiratory: Lung fields are clear.  Abdomen: Abdomen is soft, nontender, positive bowel sounds  Skin: No significant peripheral edema is noted.   Neurologic Exam  Cranial nerves: Facial symmetry is present. Speech is normal, no aphasia or dysarthria is noted. Extraocular movements are full. Visual fields are full.  Motor: The patient has good strength in all 4 extremities.  Coordination: The patient has good finger-nose-finger and heel-to-shin bilaterally.  Gait and station: The gait was not tested. No drift is seen.  Reflexes: Deep tendon reflexes are symmetric.     ASSESSMENT Roger [name] is a 52 y.o. male presenting with severe headache, photophobia and nausea. Imaging confirms a right frontal lobe IPH and SAH, ? Venous  anomoly. Etiology of  hemorrhage uncertain at this point, BP not extremely elevated on admission. On aspirin sporatically prior to admission. Patient with resultant headache, no other neuro deficits. Work up completed.   Cigarette smoker, 1/2 ppd  etoh use, daily  Marijuana use  Hx cocaine use, none recent Hypertension, pt refused medications recommended by primary MD within the past 1 week. BP 140-150s, not on scheduled medications. HIV neg  Hospital day # 2  TREATMENT/PLAN  Due to hemorrhage and risk of bleeding, do not take aspirin, aspirin-containing medications, or ibuprofen products   Home health PT No further stroke workup indicated. Patient has a 10-15% risk of having another stroke over the next year, the highest risk is within 2 weeks of the most recent stroke/TIA (risk of having a stroke following a stroke or TIA is the same). Ongoing risk factor control by Primary Care Physician Follow up with Dr. Anne Hahn, Stroke Clinic, in 2 months.   SHARON BIBY, MSN, RN, ANVP-BC, ANP-BC, GNP-BC [facility] Pager:  [phone] 11/02/2012 12:08 PM  I have personally obtained a history, examined the patient, evaluated imaging results, and formulated the assessment and plan of care. I agree with the above. Ledora Bottcher KEITH

## 2012-11-02 NOTE — Progress Notes (Signed)
Pt refuses to ask for assistance to amb to restroom.  Alarm disabled due to pt refuses to call, just gets up and is in bathroom before can get there.  Has visitors in room assisting.  Instructed on importance of calling and waiting on staff but pt continues to refuse.  Barnett Hatter P

## 2012-11-02 NOTE — Telephone Encounter (Signed)
[facility] called and said they have a handwritten Rx for Hydrocodone and need to verify the strength provider would like dispensed.  Call back number [phone].  Please advise.  Thank you.

## 2012-11-02 NOTE — Discharge Summary (Signed)
Stroke Discharge Summary  Patient ID: Roger [name]   MRN: [id number]      DOB: 06-Sep-1960  Date of Admission: 10/31/2012 Date of Discharge: 11/02/2012  Attending Physician:  Darcella Cheshire, MD, Stroke MD   Consulting Physician(s):     None  Patient's PCP:  Dr. Leavy Cella, [facility]  Discharge Diagnoses:  Principal Problem:   Right frontal lobe punctate hemorrhage Active Problems:   Vascular headache   Polysubstance dependence including opioid type drug, episodic abuse   SAH (subarachnoid hemorrhage)   Subarachnoid hemorrhage   Intraparenchymal hemorrhage of brain   Obesity, morbid  BMI: Body mass index is 30.01 kg/(m^2).  Past Medical History  Diagnosis Date  . Anxiety   . Intracerebral hemorrhage     Right frontal lobe   Past Surgical History  Procedure Laterality Date  . Hernia repair    . Cholecystectomy        Medication List    STOP taking these medications       aspirin-acetaminophen-caffeine 250-250-65 MG per tablet  Commonly known as:  EXCEDRIN MIGRAINE     butalbital-acetaminophen-caffeine 50-325-40 MG per tablet  Commonly known as:  FIORICET, ESGIC     calcium carbonate 500 MG chewable tablet  Commonly known as:  TUMS - dosed in mg elemental calcium     ketorolac 10 MG tablet  Commonly known as:  TORADOL     MULTIVITAMIN PO     naproxen sodium 220 MG tablet  Commonly known as:  ANAPROX     omeprazole 40 MG capsule  Commonly known as:  PRILOSEC     traZODone 50 MG tablet  Commonly known as:  DESYREL      TAKE these medications       ALPRAZolam 1 MG tablet  Commonly known as:  XANAX  Take 0.5 mg by mouth 2 (two) times daily as needed for sleep or anxiety.     escitalopram 10 MG tablet  Commonly known as:  LEXAPRO  Take 10 mg by mouth daily.        LABORATORY STUDIES CBC    Component Value Date/Time   WBC 14.0* 11/01/2012 0530   RBC 4.90 11/01/2012 0530   HGB 14.6 11/01/2012 0530   HCT 42.9 11/01/2012 0530   PLT 179  11/01/2012 0530   MCV 87.6 11/01/2012 0530   MCH 29.8 11/01/2012 0530   MCHC 34.0 11/01/2012 0530   RDW 12.8 11/01/2012 0530   CMP    Component Value Date/Time   NA 134* 11/01/2012 0530   K 4.2 11/01/2012 0530   CL 101 11/01/2012 0530   CO2 27 11/01/2012 0530   GLUCOSE 118* 11/01/2012 0530   BUN 12 11/01/2012 0530   CREATININE 0.92 11/01/2012 0530   CALCIUM 8.5 11/01/2012 0530   PROT 7.2 10/31/2012 1400   ALBUMIN 3.8 10/31/2012 1400   AST 18 10/31/2012 1400   ALT 26 10/31/2012 1400   ALKPHOS 53 10/31/2012 1400   BILITOT 0.4 10/31/2012 1400   GFRNONAA >90 11/01/2012 0530   GFRAA >90 11/01/2012 0530   COAGS Lab Results  Component Value Date   INR 0.94 10/31/2012   Lipid Panel No results found for this basename: chol,  trig,  hdl,  cholhdl,  vldl,  ldlcalc   HgbA1C  No results found for this basename: HGBA1C   Cardiac Panel (last 3 results) No results found for this basename: CKTOTAL, CKMB, TROPONINI, RELINDX,  in the last 72 hours Urinalysis No results found for  this basename: colorurine,  appearanceur,  labspec,  phurine,  glucoseu,  hgbur,  bilirubinur,  ketonesur,  proteinur,  urobilinogen,  nitrite,  leukocytesur   Urine Drug Screen     Component Value Date/Time   LABOPIA POSITIVE* 11/01/2012 1210   COCAINSCRNUR NONE DETECTED 11/01/2012 1210   LABBENZ POSITIVE* 11/01/2012 1210   AMPHETMU NONE DETECTED 11/01/2012 1210   THCU POSITIVE* 11/01/2012 1210   LABBARB POSITIVE* 11/01/2012 1210    Alcohol Level No results found for this basename: eth     SIGNIFICANT DIAGNOSTIC STUDIES  Ct Angio Head 10/31/2012 No evidence of high flow arteriovenous malformation. Grouping of slightly prominent spider-like vessels along the lateral margin of the right frontal hemorrhage raising the possibility of a venous anomaly/venous angioma. I do not believe this is absolutely certain however.  CT of the brain  11/01/2012 1. Stable appearance of the right anterior frontal lobe parenchymal hemorrhage with  slight subarachnoid extension. 2. No new hemorrhage or significant interval change.  10/31/2012 3.2 x 2.4 x 3.0 cm intraparenchymal hemorrhage in the right frontal lobe. There is a small amount of associated subarachnoid hemorrhage.  MRI of the brain 11/01/2012 Right frontal lobe hematoma is unchanged. No underlying mass or vascular malformation is identified. This hematoma could be caused by hypertension, cerebral amyloid, or trauma.  MRA of the brain See CT angio head  2D Echocardiogram EF 65-70% with no source of embolus.  Carotid Doppler No evidence of hemodynamically significant internal carotid artery stenosis. Vertebral artery flow is antegrade.    History of Present Illness     Roger [name] is an 52 y.o. male who developed a severe headache Tuesday night 10/26/2012. He states the headache has been constant since then and has been associated with photophobia as well as nausea. The patient has been taking opioid pain medications without relief. He did not seek medical attention until today 6/29/2014when he presented to the emergency department at [facility]. 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 There was a small amount of associated subarachnoid hemorrhage as well. A CT angiogram was also performed which showed no evidence of arteriovenous malformation although there was possibly a venous anomaly or venous angioma. The patient was transferred to [facility] to be admitted to the 3100 unit for further evaluation and treatment. He was reported to that a neurosurgeon at [facility] reviewed the patient's images and did not feel that surgery would be indicated. I do not have any actual documentation of a neurosurgery consult. The patient denied any associated visual changes, unilateral weakness, speech difficulties, or syncope.  The patient states that he was seen by his primary care physician Dr. Leavy Cella in  [place] approximately 3 weeks ago. At that time the patient was told his blood pressure was high. Dr. Leavy Cella recommended treatment with an antihypertensive medication although the patient declined   Hospital Course   Mr. Dex Blakely is a 52 y.o. male presenting with severe headache, photophobia and nausea. Imaging confirms a right frontal lobe IPH and SAH, ? Venous anomoly. Etiology of hemorrhage uncertain at this point, BP not extremely elevated on admission. On aspirin sporatically prior to admission. Patient with resultant headache, no other neuro deficits. Work up completed.  Cigarette smoker, 1/2 ppd  etoh use, daily  Marijuana use  Hx cocaine use, none recent POLYSUBSTANCE ABUSE counseling Hypertension, pt refused medications recommended by primary MD within the past 1 week.  BP 140-150s, not on scheduled medications.  HIV neg  Due to hemorrhage and risk of bleeding, do not take aspirin, aspirin-containing medications, or ibuprofen products  Home health PT No further stroke workup indicated.  Patient has a 10-15% risk of having another stroke over the next year, the highest risk is within 2 weeks of the most recent stroke/TIA (risk of having a stroke following a stroke or TIA is the same).  Ongoing risk factor control by Dr. Leavy Cella, patient needs to see within 2 weeks for hospital followup. Follow up with Dr. Anne Hahn, Stroke Clinic, in 2 months   Patient with continued stroke symptoms of decreased balance. Physical therapy, occupational therapy and speech therapy evaluated patient. They recommend outpatient therapy  Discharge Exam  Blood pressure 159/83, pulse 55, temperature 97.9 F (36.6 C), temperature source Oral, resp. rate 20, height 6' (1.829 m), weight 100.4 kg (221 lb 5.5 oz), SpO2 100.00%.   Physical Exam  General: The patient is alert and cooperative at the time of the examination.  Respiratory: Lung fields are clear.  Abdomen: Abdomen is soft, nontender,  positive bowel sounds  Skin: No significant peripheral edema is noted.  Neurologic Exam  Cranial nerves: Facial symmetry is present. Speech is normal, no aphasia or dysarthria is noted. Extraocular movements are full. Visual fields are full.  Motor: The patient has good strength in all 4 extremities.  Coordination: The patient has good finger-nose-finger and heel-to-shin bilaterally.  Gait and station: The gait was not tested. No drift is seen.  Reflexes: Deep tendon reflexes are symmetric.     Discharge Diet   Sodium Restricted thin liquids  Discharge Plan    Disposition:  home   no antithrombotics for secondary stroke prevention..due to to hemorrhage  Ongoing risk factor control by Primary Care Physician. Risk factor recommendations:  Hypertension target range 130-140/70-80 Lipid range - LDL < 100 and checked every 6 months, fasting Diabetes - HgB A1C <7 Smoking cessation Polysubstance abuse cessation     Follow-up Dr. Leavy Cella in 1 month.  Follow-up with Dr. Lesia Sago, Stroke Clinic in 2 months. The patient was prescribed an rx for narcotic until he could see his primary physician again by Dr. Anne Hahn.  45 minutes were spent preparing discharge.  Signed Gwendolyn Lima. Manson Passey, [facility], MBA, MHA [facility] Pager: [phone] 11/02/2012 2:25 PM  I have personally examined this patient, reviewed pertinent data and developed the plan of care. I agree with above.  Lesly Dukes

## 2012-11-02 NOTE — Telephone Encounter (Signed)
I have already called the pharmacy and I have clarified a prescription. The patient will be on Vicodin, taking the 5/300 mg tablets. The patient was given 60 tablets with no refills.

## 2012-11-02 NOTE — Evaluation (Signed)
Occupational Therapy Evaluation Patient Details Name: Roger [name] MRN: [id number] DOB: [birth year] Today's Date: 11/02/2012 Time: 4098-1191 OT Time Calculation (min): 23 min  OT Assessment / Plan / Recommendation History of present illness Patient is a 52 y/o male admitted with severe headache and photophobia and nausea.  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 There was a small amount of associated subarachnoid hemorrhage as well.    Clinical Impression   Pt admitted for severe HA and photophobia. Pt currently does not require skilled OT acutely and is at or near baseline. OT to sign off acutely    OT Assessment  Patient does not need any further OT services    Follow Up Recommendations  No OT follow up    Barriers to Discharge      Equipment Recommendations  None recommended by OT    Recommendations for Other Services    Frequency       Precautions / Restrictions Precautions Precautions: Fall   Pertinent Vitals/Pain C/o HA    ADL  Grooming: Independent Where Assessed - Grooming: Unsupported sitting Lower Body Bathing: Independent Where Assessed - Lower Body Bathing: Unsupported sit to stand Upper Body Dressing: Independent Where Assessed - Upper Body Dressing: Unsupported standing Lower Body Dressing: Independent Where Assessed - Lower Body Dressing: Unsupported sit to stand Toilet Transfer: Independent Toilet Transfer Equipment: Regular height toilet Transfers/Ambulation Related to ADLs: Pt very anxious upon OT arrival. Pt educated x3 during session of the purpose to OT. Pt s partner states patient is very restless naturally and does not sit down often. Pt states "honey this is me, if i am wake my feet will hit the floor and I am going" Pt ambulated > 100 ft due to anxious and restless in room. Pt describes photo sensitive but upon exiting room with sunglasses pt removes glasses stating I can't see what I  want to see.  ADL Comments: Pt don shorts and sunglasses. Pt able to recall items in room inside of bags from memory. ("get my sunglasses from that bag in the pocket" "i want the purple shorts in that drawer") Pt able to dress without deficits. Pt ambulates with internal rotation and decr gait velocity but reports that is normal. Pt resting in lobby of [facility] with glasses off talking randomly to visitors. Pt expressed concern over finanical status and a very sick mother. Pt states "i know what did that its that century 21 crap they put in that drink" Pt describes taking a new drug that a friend offered in a cocktail. Pt states "those days are gone I have to make some changes . I can't be hanging around with a party crowd cuz I want to have fun too." pt laughs. Pt is at or near baseline based on this evaluation. Pt and partner both agree that no OT needs are present at this time. Pt demonstrates normal fine motor and c/o only of severe HA    OT Diagnosis:    OT Problem List:   OT Treatment Interventions:     OT Goals(Current goals can be found in the care plan section)    Visit Information  Last OT Received On: 11/02/12 Assistance Needed: +1 History of Present Illness: Patient is a 52 y/o male admitted with severe headache and photophobia and nausea.  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 There was a  small amount of associated subarachnoid hemorrhage as well.        Prior Functioning     Home Living Family/patient expects to be discharged to:: Private residence Living Arrangements: Spouse/significant other Available Help at Discharge: Family Type of Home: House Home Access: Stairs to enter Secretary/administrator of Steps: 3 Entrance Stairs-Rails: Left;Right;Can reach both Home Layout: One level Home Equipment: Other (comment) Additional Comments: Pt describes need to move from current location, owns 3 homes and works as an  independent Social worker. Pt is very stressed over financial situation and concerns. Prior Function Level of Independence: Independent Comments: works at Interior and spatial designer, Financial planner: No difficulties Dominant Hand: Right         Vision/Perception Vision - History Baseline Vision: No visual deficits Patient Visual Report: No change from baseline Vision - Assessment Eye Alignment: Within Functional Limits Vision Assessment: Vision tested Ocular Range of Motion: Within Functional Limits Convergence: Within functional limits   Cognition  Cognition Arousal/Alertness: Awake/alert Behavior During Therapy: WFL for tasks assessed/performed Overall Cognitive Status: Within Functional Limits for tasks assessed    Extremity/Trunk Assessment Upper Extremity Assessment Upper Extremity Assessment: Overall WFL for tasks assessed Lower Extremity Assessment Lower Extremity Assessment: Overall WFL for tasks assessed Cervical / Trunk Assessment Cervical / Trunk Assessment: Normal     Mobility Bed Mobility Bed Mobility: Supine to Sit;Sitting - Scoot to Edge of Bed;Sit to Supine Right Sidelying to Sit: 7: Independent Supine to Sit: 7: Independent Sitting - Scoot to Edge of Bed: 7: Independent Details for Bed Mobility Assistance: Pt able to climb into bed with partner and rearrange covers to properly cover both people.  Transfers Transfers: Sit to Stand;Stand to Sit Sit to Stand: 7: Independent Stand to Sit: 7: Independent     Exercise     Balance High Level Balance High Level Balance Activites: Direction changes;Turns High Level Balance Comments: Pt with decr attention to task and needed redirection. Pt easily distracted by visitors and new surroundings. Partner reports this is baseline   End of Session OT - End of Session Activity Tolerance: Patient tolerated treatment well Patient left: in bed;with call bell/phone within reach (no bed alarm due to pt constantly  changing position in room) Nurse Communication: Mobility status  GO     Harrel Carina [facility] 11/02/2012, 11:58 AM Pager: [phone]

## 2012-11-12 ENCOUNTER — Ambulatory Visit: Payer: Self-pay | Admitting: Specialist

## 2012-11-12 LAB — CREATININE, SERUM
Creatinine: 1.14 mg/dL (ref 0.60–1.30)
EGFR (Non-African Amer.): >60

## 2013-05-12 ENCOUNTER — Other Ambulatory Visit (HOSPITAL_COMMUNITY): Payer: Self-pay | Admitting: Family Medicine

## 2013-05-12 ENCOUNTER — Ambulatory Visit (HOSPITAL_COMMUNITY)
Admission: RE | Admit: 2013-05-12 | Discharge: 2013-05-12 | Disposition: A | Discharge status: Home / Self Care | Payer: BC Managed Care – PPO | Source: Ambulatory Visit | Attending: Family Medicine | Admitting: Family Medicine

## 2013-05-12 DIAGNOSIS — W108XXA Fall (on) (from) other stairs and steps, initial encounter: Secondary | ICD-10-CM

## 2013-05-12 DIAGNOSIS — Z043 Encounter for examination and observation following other accident: Secondary | ICD-10-CM | POA: Insufficient documentation

## 2013-05-12 DIAGNOSIS — W19XXXA Unspecified fall, initial encounter: Secondary | ICD-10-CM | POA: Insufficient documentation

## 2014-03-13 ENCOUNTER — Ambulatory Visit: Payer: Self-pay | Admitting: Gastroenterology

## 2014-03-13 LAB — CBC WITH DIFFERENTIAL/PLATELET
Basophil #: 0.1 10*3/uL (ref 0.0–0.1)
Basophil %: 0.8 %
EOS ABS: 0.2 10*3/uL (ref 0.0–0.7)
Eosinophil %: 3.2 %
HCT: 47.2 % (ref 40.0–52.0)
HGB: 15.4 g/dL (ref 13.0–18.0)
Lymphocyte #: 2.3 10*3/uL (ref 1.0–3.6)
Lymphocyte %: 32.1 %
MCH: 29.3 pg (ref 26.0–34.0)
MCHC: 32.7 g/dL (ref 32.0–36.0)
MCV: 89 fL (ref 80–100)
MONOS PCT: 8.6 %
Monocyte #: 0.6 x10 3/mm (ref 0.2–1.0)
Neutrophil #: 3.9 10*3/uL (ref 1.4–6.5)
Neutrophil %: 55.3 %
PLATELETS: 211 10*3/uL (ref 150–440)
RBC: 5.28 10*6/uL (ref 4.40–5.90)
RDW: 13.4 % (ref 11.5–14.5)
WBC: 7.1 10*3/uL (ref 3.8–10.6)

## 2014-03-13 LAB — PROTIME-INR
INR: 1
PROTHROMBIN TIME: 13.2 s (ref 11.5–14.7)

## 2014-04-10 ENCOUNTER — Ambulatory Visit (INDEPENDENT_AMBULATORY_CARE_PROVIDER_SITE_OTHER): Payer: BC Managed Care – PPO | Admitting: Family

## 2014-04-10 ENCOUNTER — Ambulatory Visit (INDEPENDENT_AMBULATORY_CARE_PROVIDER_SITE_OTHER): Payer: BC Managed Care – PPO

## 2014-04-10 ENCOUNTER — Encounter: Payer: Self-pay | Admitting: Family

## 2014-04-10 VITALS — BP 130/92 | HR 69 | Temp 98.1°F | Resp 18 | Ht 71.0 in | Wt 238.4 lb

## 2014-04-10 DIAGNOSIS — I10 Essential (primary) hypertension: Secondary | ICD-10-CM | POA: Insufficient documentation

## 2014-04-10 DIAGNOSIS — Z23 Encounter for immunization: Secondary | ICD-10-CM

## 2014-04-10 DIAGNOSIS — F908 Attention-deficit hyperactivity disorder, other type: Secondary | ICD-10-CM

## 2014-04-10 DIAGNOSIS — F909 Attention-deficit hyperactivity disorder, unspecified type: Secondary | ICD-10-CM | POA: Insufficient documentation

## 2014-04-10 NOTE — Progress Notes (Signed)
Pre visit review using our clinic review tool, if applicable. No additional management support is needed unless otherwise documented below in the visit note. 

## 2014-04-10 NOTE — Assessment & Plan Note (Signed)
Stable. Continue current dosage of Adderall per Dr. Robina Ade of psychiatry.

## 2014-04-10 NOTE — Progress Notes (Signed)
   Subjective:    Patient ID: Roger [name], male    DOB: 1960-05-30, 53 y.o.   MRN: [id number]  Chief Complaint  Patient presents with  . Establish Care    No issues that need to be discussed    HPI:  Roger [name] is a 53 y.o. male who presents today to establish care.   1) ADHD - Dr. Robina Ade has been managing his Adderrall. Continues to be maintained on Adderall. Denies any adverse effects of Adderall to appepitite or sleeping, or heart palpitations.  2) Hypertension - currently maintained on lisinopril-HCTZ. Denies any adverse effects of cough, chest pain/discomfort, shortness of breath or edema. Due for eye exam.   BP Readings from Last 3 Encounters:  04/10/14 130/92   No Known Allergies  No current outpatient prescriptions on file prior to visit.   No current facility-administered medications on file prior to visit.   Review of Systems    See HPI  Objective:    BP 130/92 mmHg  Pulse 69  Temp(Src) 98.1 F (36.7 C) (Oral)  Resp 18  Ht 5\' 11"  (1.803 m)  Wt 238 lb 6.4 oz (108.138 kg)  BMI 33.26 kg/m2  SpO2 95% Nursing note and vital signs reviewed.  Physical Exam  Constitutional: He is oriented to person, place, and time. He appears well-developed and well-nourished. No distress.  Cardiovascular: Normal rate, regular rhythm, normal heart sounds and intact distal pulses.   Pulmonary/Chest: Effort normal and breath sounds normal.  Neurological: He is alert and oriented to person, place, and time.  Skin: Skin is warm and dry.  Psychiatric: He has a normal mood and affect. His behavior is normal. Judgment and thought content normal.      Assessment & Plan:

## 2014-04-10 NOTE — Assessment & Plan Note (Signed)
Currently stable. Continue lisinopril-HCTZ at current dose. Will be due to check kidney function with physical.

## 2014-04-10 NOTE — Patient Instructions (Addendum)
Thank you for choosing [facility].  Summary/Instructions:  Please schedule a time for your physical at your convenience.   Smoking Cessation Quitting smoking is important to your health and has many advantages. However, it is not always easy to quit since nicotine is a very addictive drug. Oftentimes, people try 3 times or more before being able to quit. This document explains the best ways for you to prepare to quit smoking. Quitting takes hard work and a lot of effort, but you can do it. ADVANTAGES OF QUITTING SMOKING  You will live longer, feel better, and live better.  Your body will feel the impact of quitting smoking almost immediately.  Within 20 minutes, blood pressure decreases. Your pulse returns to its normal level.  After 8 hours, carbon monoxide levels in the blood return to normal. Your oxygen level increases.  After 24 hours, the chance of having a heart attack starts to decrease. Your breath, hair, and body stop smelling like smoke.  After 48 hours, damaged nerve endings begin to recover. Your sense of taste and smell improve.  After 72 hours, the body is virtually free of nicotine. Your bronchial tubes relax and breathing becomes easier.  After 2 to 12 weeks, lungs can hold more air. Exercise becomes easier and circulation improves.  The risk of having a heart attack, stroke, cancer, or lung disease is greatly reduced.  After 1 year, the risk of coronary heart disease is cut in half.  After 5 years, the risk of stroke falls to the same as a nonsmoker.  After 10 years, the risk of lung cancer is cut in half and the risk of other cancers decreases significantly.  After 15 years, the risk of coronary heart disease drops, usually to the level of a nonsmoker.  If you are pregnant, quitting smoking will improve your chances of having a healthy baby.  The people you live with, especially any children, will be healthier.  You will have extra money to spend on  things other than cigarettes. QUESTIONS TO THINK ABOUT BEFORE ATTEMPTING TO QUIT You may want to talk about your answers with your health care provider.  Why do you want to quit?  If you tried to quit in the past, what helped and what did not?  What will be the most difficult situations for you after you quit? How will you plan to handle them?  Who can help you through the tough times? Your family? Friends? A health care provider?  What pleasures do you get from smoking? What ways can you still get pleasure if you quit? Here are some questions to ask your health care provider:  How can you help me to be successful at quitting?  What medicine do you think would be best for me and how should I take it?  What should I do if I need more help?  What is smoking withdrawal like? How can I get information on withdrawal? GET READY  Set a quit date.  Change your environment by getting rid of all cigarettes, ashtrays, matches, and lighters in your home, car, or work. Do not let people smoke in your home.  Review your past attempts to quit. Think about what worked and what did not. GET SUPPORT AND ENCOURAGEMENT You have a better chance of being successful if you have help. You can get support in many ways.  Tell your family, friends, and coworkers that you are going to quit and need their support. Ask them not to smoke  around you.  Get individual, group, or telephone counseling and support. Programs are available at [employer] and health centers. Call your local health department for information about programs in your area.  Spiritual beliefs and practices may help some smokers quit.  Download a "quit meter" on your computer to keep track of quit statistics, such as how long you have gone without smoking, cigarettes not smoked, and money saved.  Get a self-help book about quitting smoking and staying off tobacco. Octa yourself from urges to  smoke. Talk to someone, go for a walk, or occupy your time with a task.  Change your normal routine. Take a different route to work. Drink tea instead of coffee. Eat breakfast in a different place.  Reduce your stress. Take a hot bath, exercise, or read a book.  Plan something enjoyable to do every day. Reward yourself for not smoking.  Explore interactive web-based programs that specialize in helping you quit. GET MEDICINE AND USE IT CORRECTLY Medicines can help you stop smoking and decrease the urge to smoke. Combining medicine with the above behavioral methods and support can greatly increase your chances of successfully quitting smoking.  Nicotine replacement therapy helps deliver nicotine to your body without the negative effects and risks of smoking. Nicotine replacement therapy includes nicotine gum, lozenges, inhalers, nasal sprays, and skin patches. Some may be available over-the-counter and others require a prescription.  Antidepressant medicine helps people abstain from smoking, but how this works is unknown. This medicine is available by prescription.  Nicotinic receptor partial agonist medicine simulates the effect of nicotine in your brain. This medicine is available by prescription. Ask your health care provider for advice about which medicines to use and how to use them based on your health history. Your health care provider will tell you what side effects to look out for if you choose to be on a medicine or therapy. Carefully read the information on the package. Do not use any other product containing nicotine while using a nicotine replacement product.  RELAPSE OR DIFFICULT SITUATIONS Most relapses occur within the first 3 months after quitting. Do not be discouraged if you start smoking again. Remember, most people try several times before finally quitting. You may have symptoms of withdrawal because your body is used to nicotine. You may crave cigarettes, be irritable, feel  very hungry, cough often, get headaches, or have difficulty concentrating. The withdrawal symptoms are only temporary. They are strongest when you first quit, but they will go away within 10-14 days. To reduce the chances of relapse, try to:  Avoid drinking alcohol. Drinking lowers your chances of successfully quitting.  Reduce the amount of caffeine you consume. Once you quit smoking, the amount of caffeine in your body increases and can give you symptoms, such as a rapid heartbeat, sweating, and anxiety.  Avoid smokers because they can make you want to smoke.  Do not let weight gain distract you. Many smokers will gain weight when they quit, usually less than 10 pounds. Eat a healthy diet and stay active. You can always lose the weight gained after you quit.  Find ways to improve your mood other than smoking. FOR MORE INFORMATION  www.smokefree.gov  Document Released: 04/15/2001 Document Revised: 09/05/2013 Document Reviewed: 07/31/2011 [facility] Patient Information 2015 [place], [place]. This information is not intended to replace advice given to you by your health care provider. Make sure you discuss any questions you have with your health care provider.

## 2014-04-11 ENCOUNTER — Telehealth: Payer: Self-pay | Admitting: Family

## 2014-04-11 NOTE — Telephone Encounter (Signed)
emmi mailed  °

## 2014-05-15 ENCOUNTER — Encounter: Payer: Self-pay | Admitting: Family

## 2014-05-15 ENCOUNTER — Other Ambulatory Visit (INDEPENDENT_AMBULATORY_CARE_PROVIDER_SITE_OTHER): Payer: BLUE CROSS/BLUE SHIELD

## 2014-05-15 ENCOUNTER — Other Ambulatory Visit: Payer: Self-pay

## 2014-05-15 ENCOUNTER — Ambulatory Visit (INDEPENDENT_AMBULATORY_CARE_PROVIDER_SITE_OTHER): Payer: BLUE CROSS/BLUE SHIELD | Admitting: Family

## 2014-05-15 VITALS — BP 120/74 | HR 75 | Temp 98.0°F | Ht 71.0 in | Wt 238.0 lb

## 2014-05-15 DIAGNOSIS — G4730 Sleep apnea, unspecified: Secondary | ICD-10-CM | POA: Insufficient documentation

## 2014-05-15 DIAGNOSIS — Z0000 Encounter for general adult medical examination without abnormal findings: Secondary | ICD-10-CM

## 2014-05-15 DIAGNOSIS — Z23 Encounter for immunization: Secondary | ICD-10-CM

## 2014-05-15 LAB — BASIC METABOLIC PANEL
BUN: 18 mg/dL (ref 6–23)
CALCIUM: 9.2 mg/dL (ref 8.4–10.5)
CO2: 27 meq/L (ref 19–32)
Chloride: 103 mEq/L (ref 96–112)
Creatinine, Ser: 1.1 mg/dL (ref 0.4–1.5)
GFR: 77.02 mL/min (ref >60.00)
GLUCOSE: 101 mg/dL — AB (ref 70–99)
Potassium: 4.2 mEq/L (ref 3.5–5.1)
SODIUM: 138 meq/L (ref 135–145)

## 2014-05-15 LAB — CBC
HCT: 49.2 % (ref 39.0–52.0)
HEMOGLOBIN: 16.5 g/dL (ref 13.0–17.0)
MCHC: 33.6 g/dL (ref 30.0–36.0)
MCV: 86.9 fl (ref 78.0–100.0)
Platelets: 239 10*3/uL (ref 150.0–400.0)
RBC: 5.66 Mil/uL (ref 4.22–5.81)
RDW: 12.9 % (ref 11.5–15.5)
WBC: 9.5 10*3/uL (ref 4.0–10.5)

## 2014-05-15 LAB — LIPID PANEL
CHOL/HDL RATIO: 7
Cholesterol: 178 mg/dL (ref 0–200)
HDL: 26.6 mg/dL — ABNORMAL LOW (ref >39.00)
NONHDL: 151.4
Triglycerides: 214 mg/dL — ABNORMAL HIGH (ref 0.0–149.0)
VLDL: 42.8 mg/dL — AB (ref 0.0–40.0)

## 2014-05-15 LAB — PSA: PSA: 1.11 ng/mL (ref 0.10–4.00)

## 2014-05-15 LAB — LDL CHOLESTEROL, DIRECT: Direct LDL: 92.2 mg/dL

## 2014-05-15 NOTE — Progress Notes (Signed)
Subjective:    Patient ID: Roger [name], male    DOB: [birth year], 54 y.o.   MRN: [id number]  Chief Complaint  Patient presents with  . Annual Exam    HPI:  Roger [name] is a 54 y.o. male who presents today for an annual wellness visit.   1) Health Maintenance - Health seems ok  Diet - regular; making adjustments to diet Exercise - Started at gym today; walking 30 min; goal at least 5x per week.   2) Preventative Exams / Immunizations:  Dental -- Up to date Vision -- Up to date   Health Maintenance  Topic Date Due  . TETANUS/TDAP  02/07/1978  . INFLUENZA VACCINE  12/04/2014  . COLONOSCOPY  03/06/2024    Immunization History  Administered Date(s) Administered  . Influenza,inj,Quad PF,36+ Mos 04/10/2014    No Known Allergies    Current Outpatient Prescriptions on File Prior to Visit  Medication Sig Dispense Refill  . ALPRAZolam (XANAX) 1 MG tablet Take 1 mg by mouth as needed for anxiety.    Marland Kitchen amphetamine-dextroamphetamine (ADDERALL) 20 MG tablet Take 20 mg by mouth daily.    Marland Kitchen escitalopram (LEXAPRO) 10 MG tablet Take 10 mg by mouth daily.    Marland Kitchen lisinopril-hydrochlorothiazide (PRINZIDE,ZESTORETIC) 10-12.5 MG per tablet Take 1 tablet by mouth daily.    . pantoprazole (PROTONIX) 40 MG tablet Take 40 mg by mouth daily.    . traZODone (DESYREL) 100 MG tablet Take 100 mg by mouth at bedtime.     No current facility-administered medications on file prior to visit.   Past Medical History  Diagnosis Date  . Allergy   . Hypertension   . Kidney stones   . Stroke     hemorrhagic    History reviewed. No pertinent past surgical history.    Family History  Problem Relation Age of Onset  . Pancreatic cancer Mother   . Hypertension Mother   . Diabetes Mother   . Obesity Mother     History   Social History  . Marital Status: Single    Spouse Name: N/A    Number of Children: 0  . Years of Education: 12   Occupational History  . Hairdresser    Social History  Main Topics  . Smoking status: Current Every Day Smoker -- 0.50 packs/day for 10 years    Types: Cigarettes  . Smokeless tobacco: Not on file  . Alcohol Use: Yes     Comment: One drink a night  . Drug Use: Yes    Special: Marijuana  . Sexual Activity: Not on file   Other Topics Concern  . Not on file   Social History Narrative   Born in [place], [place] and raised in [place], [place]. Currently resides in a private residence with his partner and roommate. Fun: Work   Denies any religious beliefs effecting healthcare.           Review of Systems  Constitutional: Denies fever, chills, fatigue, or significant weight gain/loss. HENT: Head: Denies headache or neck pain Ears: Denies changes in hearing, ringing in ears, earache, drainage Nose: Denies discharge, stuffiness, itching, nosebleed, sinus pain Throat: Denies sore throat, hoarseness, dry mouth, sores, thrush Eyes: Denies loss/changes in vision, pain, redness, blurry/double vision, flashing lights Cardiovascular: Denies chest pain/discomfort, tightness, palpitations, shortness of breath with activity, difficulty lying down, swelling, sudden awakening with shortness of breath Respiratory: Denies shortness of breath, cough, sputum production, wheezing Gastrointestinal: Denies dysphasia, heartburn, change in appetite, nausea, change in  bowel habits, rectal bleeding, constipation, diarrhea, yellow skin or eyes Genitourinary: Denies frequency, urgency, burning/pain, blood in urine, incontinence, change in urinary strength. Musculoskeletal: Denies muscle/joint pain, stiffness, back pain, redness or swelling of joints, trauma Skin: Denies rashes, lumps, itching, dryness, color changes, or hair/nail changes Neurological: Denies dizziness, fainting, seizures, weakness, numbness, tingling, tremor Psychiatric - Denies nervousness, stress, depression or memory loss Endocrine: Denies heat or cold intolerance, sweating, frequent urination,  excessive thirst, changes in appetite Hematologic: Denies ease of bruising or bleeding     Objective:    BP 120/74 mmHg  Pulse 75  Temp(Src) 98 F (36.7 C) (Oral)  Ht 5\' 11"  (1.803 m)  Wt 238 lb (107.956 kg)  BMI 33.21 kg/m2  SpO2 93% Nursing note and vital signs reviewed.  Physical Exam  Constitutional: He is oriented to person, place, and time. He appears well-developed and well-nourished.  HENT:  Head: Normocephalic.  Right Ear: Hearing, tympanic membrane, external ear and ear canal normal.  Left Ear: Hearing, tympanic membrane, external ear and ear canal normal.  Nose: Nose normal.  Mouth/Throat: Uvula is midline, oropharynx is clear and moist and mucous membranes are normal.  Eyes: Conjunctivae and EOM are normal. Pupils are equal, round, and reactive to light.  Neck: Neck supple. No JVD present. No tracheal deviation present. No thyromegaly present.  Cardiovascular: Normal rate, regular rhythm, normal heart sounds and intact distal pulses.   Pulmonary/Chest: Effort normal and breath sounds normal.  Abdominal: Soft. Bowel sounds are normal. He exhibits no distension and no mass. There is no tenderness. There is no rebound and no guarding.  Musculoskeletal: Normal range of motion. He exhibits no edema or tenderness.  Lymphadenopathy:    He has no cervical adenopathy.  Neurological: He is alert and oriented to person, place, and time. He has normal reflexes. No cranial nerve deficit. He exhibits normal muscle tone. Coordination normal.  Skin: Skin is warm and dry.  Psychiatric: He has a normal mood and affect. His behavior is normal. Judgment and thought content normal.       Assessment & Plan:

## 2014-05-15 NOTE — Progress Notes (Signed)
Pre visit review using our clinic review tool, if applicable. No additional management support is needed unless otherwise documented below in the visit note. 

## 2014-05-15 NOTE — Assessment & Plan Note (Signed)
1) Anticipatory Guidance: Discussed importance of wearing a seatbelt while driving and not texting while driving; changing batteries in smoke detector at least once annually; wearing suntan lotion when outside; eating a balanced and moderate diet; getting physical activity at least 30 minutes per day.  2) Immunizations / Screenings / Labs:  Tetanus shot given in office today. All other immunizations are up-to-date per recommendations. All screenings are up-to-date per recommendations. Obtain CBC, BMET, Lipid profile, PSA and TSH.    Overall well exam. Discussed with patient's body mass index and being overweight. Has currently established goals of increasing physical activity and improving the nutrient density of his diet. This goes along with generalized health recommendations. Only other risk factor discussed with the smoking, patient is not ready to quit smoking at this time. Follow up prevention exam in 1 year or sooner, pending lab work.

## 2014-05-15 NOTE — Patient Instructions (Signed)
Thank you for choosing [facility].  Summary/Instructions:   Please stop by the lab on the basement level of the building for your blood work. Your results will be released to MyChart (or called to you) after review, usually within 72 hours after test completion. If any changes need to be made, you will be notified at that same time.  Health Maintenance A healthy lifestyle and preventative care can promote health and wellness.  Maintain regular health, dental, and eye exams.  Eat a healthy diet. Foods like vegetables, fruits, whole grains, low-fat dairy products, and lean protein foods contain the nutrients you need and are low in calories. Decrease your intake of foods high in solid fats, added sugars, and salt. Get information about a proper diet from your health care provider, if necessary.  Regular physical exercise is one of the most important things you can do for your health. Most adults should get at least 150 minutes of moderate-intensity exercise (any activity that increases your heart rate and causes you to sweat) each week. In addition, most adults need muscle-strengthening exercises on 2 or more days a week.   Maintain a healthy weight. The body mass index (BMI) is a screening tool to identify possible weight problems. It provides an estimate of body fat based on height and weight. Your health care provider can find your BMI and can help you achieve or maintain a healthy weight. For males 20 years and older:  A BMI below 18.5 is considered underweight.  A BMI of 18.5 to 24.9 is normal.  A BMI of 25 to 29.9 is considered overweight.  A BMI of 30 and above is considered obese.  Maintain normal blood lipids and cholesterol by exercising and minimizing your intake of saturated fat. Eat a balanced diet with plenty of fruits and vegetables. Blood tests for lipids and cholesterol should begin at age 20 and be repeated every 5 years. If your lipid or cholesterol levels are  high, you are over age 50, or you are at high risk for heart disease, you may need your cholesterol levels checked more frequently.Ongoing high lipid and cholesterol levels should be treated with medicines if diet and exercise are not working.  If you smoke, find out from your health care provider how to quit. If you do not use tobacco, do not start.  Lung cancer screening is recommended for adults aged 55-80 years who are at high risk for developing lung cancer because of a history of smoking. A yearly low-dose CT scan of the lungs is recommended for people who have at least a 30-pack-year history of smoking and are current smokers or have quit within the past 15 years. A pack year of smoking is smoking an average of 1 pack of cigarettes a day for 1 year (for example, a 30-pack-year history of smoking could mean smoking 1 pack a day for 30 years or 2 packs a day for 15 years). Yearly screening should continue until the smoker has stopped smoking for at least 15 years. Yearly screening should be stopped for people who develop a health problem that would prevent them from having lung cancer treatment.  If you choose to drink alcohol, do not have more than 2 drinks per day. One drink is considered to be 12 oz (360 mL) of beer, 5 oz (150 mL) of wine, or 1.5 oz (45 mL) of liquor.  Avoid the use of street drugs. Do not share needles with anyone. Ask for help if you need   support or instructions about stopping the use of drugs.  High blood pressure causes heart disease and increases the risk of stroke. Blood pressure should be checked at least every 1-2 years. Ongoing high blood pressure should be treated with medicines if weight loss and exercise are not effective.  If you are 45-79 years old, ask your health care provider if you should take aspirin to prevent heart disease.  Diabetes screening involves taking a blood sample to check your fasting blood sugar level. This should be done once every 3 years  after age 45 if you are at a normal weight and without risk factors for diabetes. Testing should be considered at a younger age or be carried out more frequently if you are overweight and have at least 1 risk factor for diabetes.  Colorectal cancer can be detected and often prevented. Most routine colorectal cancer screening begins at the age of 50 and continues through age 75. However, your health care provider may recommend screening at an earlier age if you have risk factors for colon cancer. On a yearly basis, your health care provider may provide home test kits to check for hidden blood in the stool. A small camera at the end of a tube may be used to directly examine the colon (sigmoidoscopy or colonoscopy) to detect the earliest forms of colorectal cancer. Talk to your health care provider about this at age 50 when routine screening begins. A direct exam of the colon should be repeated every 5-10 years through age 75, unless early forms of precancerous polyps or small growths are found.  People who are at an increased risk for hepatitis B should be screened for this virus. You are considered at high risk for hepatitis B if:  You were born in a country where hepatitis B occurs often. Talk with your health care provider about which countries are considered high risk.  Your parents were born in a high-risk country and you have not received a shot to protect against hepatitis B (hepatitis B vaccine).  You have HIV or AIDS.  You use needles to inject street drugs.  You live with, or have sex with, someone who has hepatitis B.  You are a man who has sex with other men (MSM).  You get hemodialysis treatment.  You take certain medicines for conditions like cancer, organ transplantation, and autoimmune conditions.  Hepatitis C blood testing is recommended for all people born from 1945 through 1965 and any individual with known risk factors for hepatitis C.  Healthy men should no longer receive  prostate-specific antigen (PSA) blood tests as part of routine cancer screening. Talk to your health care provider about prostate cancer screening.  Testicular cancer screening is not recommended for adolescents or adult males who have no symptoms. Screening includes self-exam, a health care provider exam, and other screening tests. Consult with your health care provider about any symptoms you have or any concerns you have about testicular cancer.  Practice safe sex. Use condoms and avoid high-risk sexual practices to reduce the spread of sexually transmitted infections (STIs).  You should be screened for STIs, including gonorrhea and chlamydia if:  You are sexually active and are younger than 24 years.  You are older than 24 years, and your health care provider tells you that you are at risk for this type of infection.  Your sexual activity has changed since you were last screened, and you are at an increased risk for chlamydia or gonorrhea. Ask your health care   provider if you are at risk.  If you are at risk of being infected with HIV, it is recommended that you take a prescription medicine daily to prevent HIV infection. This is called pre-exposure prophylaxis (PrEP). You are considered at risk if:  You are a man who has sex with other men (MSM).  You are a heterosexual man who is sexually active with multiple partners.  You take drugs by injection.  You are sexually active with a partner who has HIV.  Talk with your health care provider about whether you are at high risk of being infected with HIV. If you choose to begin PrEP, you should first be tested for HIV. You should then be tested every 3 months for as long as you are taking PrEP.  Use sunscreen. Apply sunscreen liberally and repeatedly throughout the day. You should seek shade when your shadow is shorter than you. Protect yourself by wearing long sleeves, pants, a wide-brimmed hat, and sunglasses year round whenever you are  outdoors.  Tell your health care provider of new moles or changes in moles, especially if there is a change in shape or color. Also, tell your health care provider if a mole is larger than the size of a pencil eraser.  A one-time screening for abdominal aortic aneurysm (AAA) and surgical repair of large AAAs by ultrasound is recommended for men aged 65-75 years who are current or former smokers.  Stay current with your vaccines (immunizations). Document Released: 10/18/2007 Document Revised: 04/26/2013 Document Reviewed: 09/16/2010 Exit[employer] Patient Information 2015 [employer]. This information is not intended to replace advice given to you by your health care provider. Make sure you discuss any questions you have with your health care provider.   

## 2014-05-16 ENCOUNTER — Telehealth: Payer: Self-pay | Admitting: Family

## 2014-05-16 LAB — TSH: TSH: 1.38 u[IU]/mL (ref 0.35–4.50)

## 2014-05-16 NOTE — Telephone Encounter (Signed)
Called pt and left message for him to call back

## 2014-05-16 NOTE — Telephone Encounter (Signed)
Please inform patient that his labs overall looked good with a couple of areas that need attention. The first is fasting glucose was slightly elevated into the pre-diabetes range, no medication is needed for this, however can be improved with increasing fruits and vegetables while decreasing saturated fats and processed foods. The second area is his cholesterol. His total cholesterol and bad cholesterol were good, however his good cholesterol was low. This can be increased by taking a fish oil supplement or consuming fish may also try adding flaxseed, olive oil, or peanut butter or walnuts to name a few.  Otherwise his kidney function, white/red blood cells, electrolytes, thyroid and prostate were normal.

## 2014-05-17 NOTE — Telephone Encounter (Signed)
Pt aware of results. Will mail results.

## 2014-08-28 LAB — SURGICAL PATHOLOGY

## 2014-10-07 IMAGING — CT CT ANGIO HEAD
2 of 6 series · 16 of 37 positions shown · IV contrast (OMNIPAQUE 300)
Comparison: Head CT same day

CLINICAL DATA: Right frontal intraparenchymal hematoma.

CT ANGIOGRAPHY HEAD
TECHNIQUE: Multidetector CT imaging of the head was performed
using the standard protocol during bolus administration of
intravenous contrast.  Multiplanar CT image reconstructions
including MIPs were obtained to evaluate the vascular anatomy.
Contrast:   100 ml [id number]

[Series 6: axial · axial · 0.39mm/px · z∈[-129,+9]mm · 13 of 171 slices shown]
[im 12/171  brain]
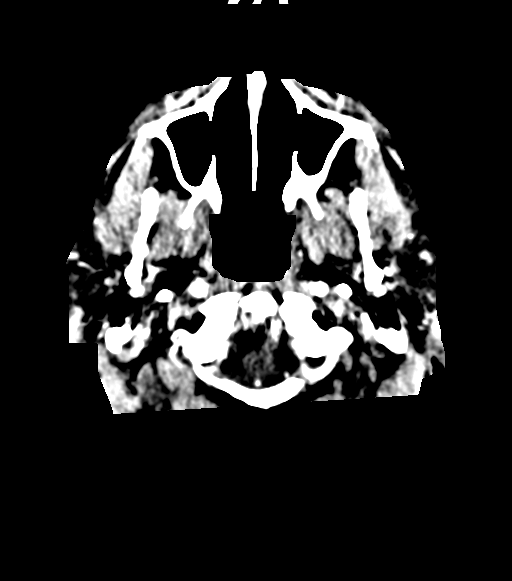
[im 23/171  bone]
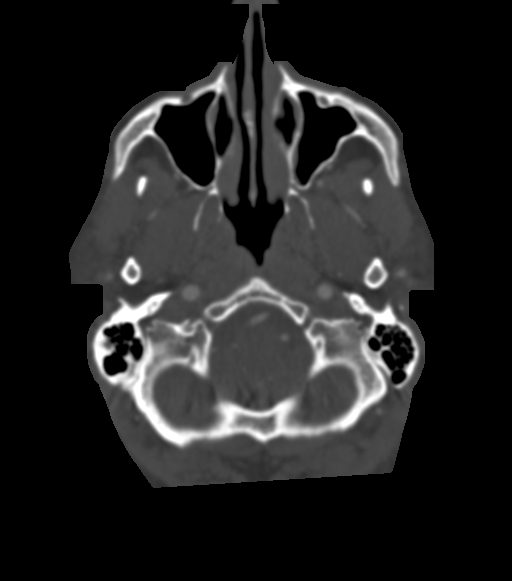
[im 35/171  brain]
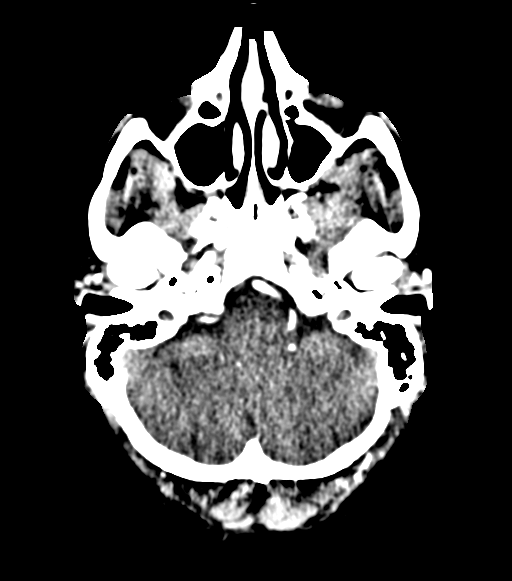
[im 46/171  bone]
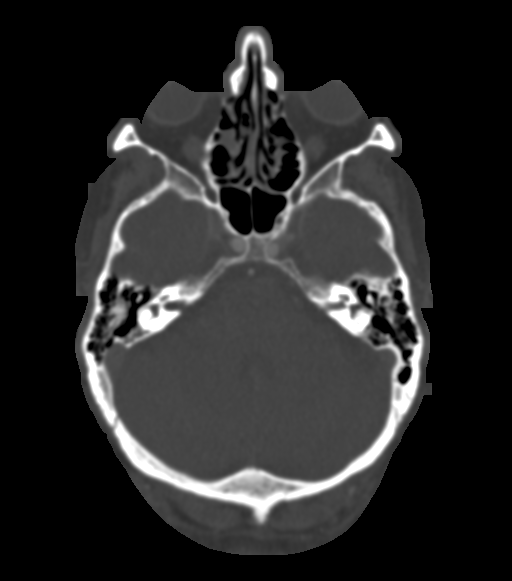
[im 57/171  brain]
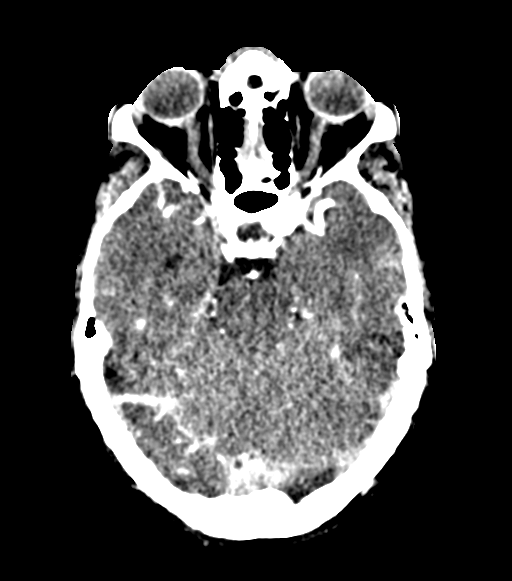
[im 69/171  bone]
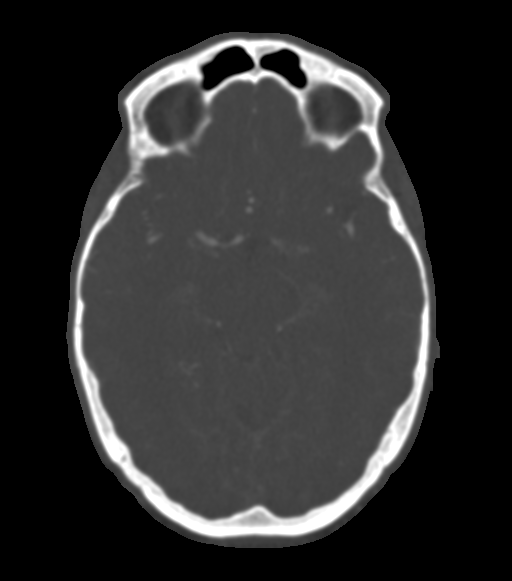
[im 91/171  brain]
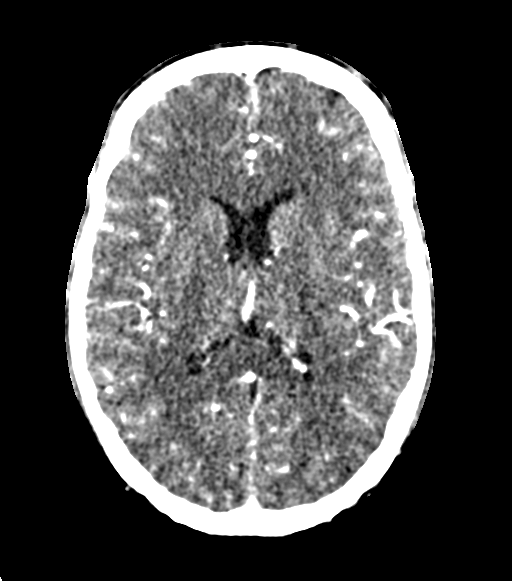
[im 103/171  bone]
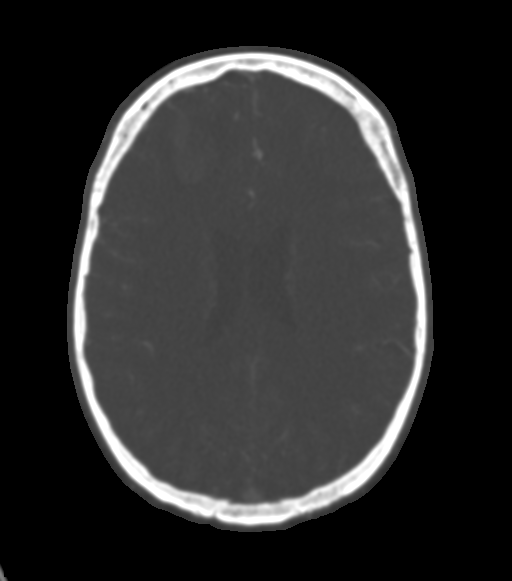
[im 114/171  brain]
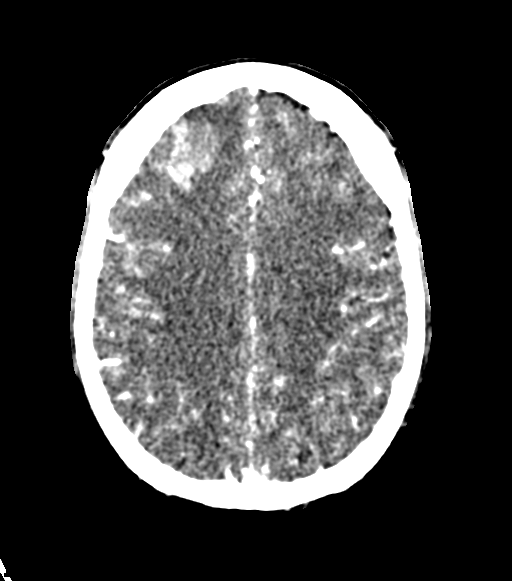
[im 125/171  bone]
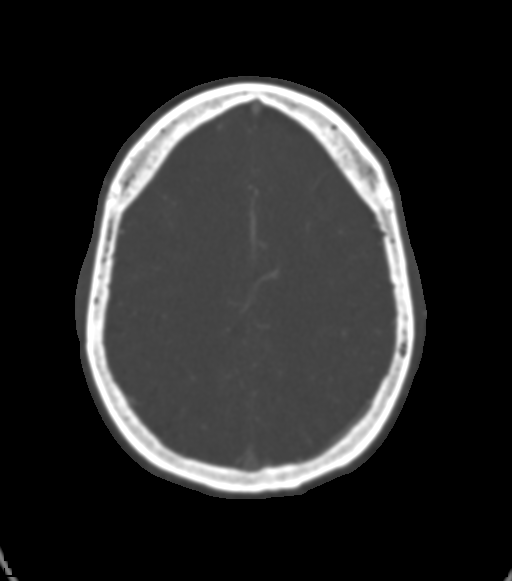
[im 137/171  brain]
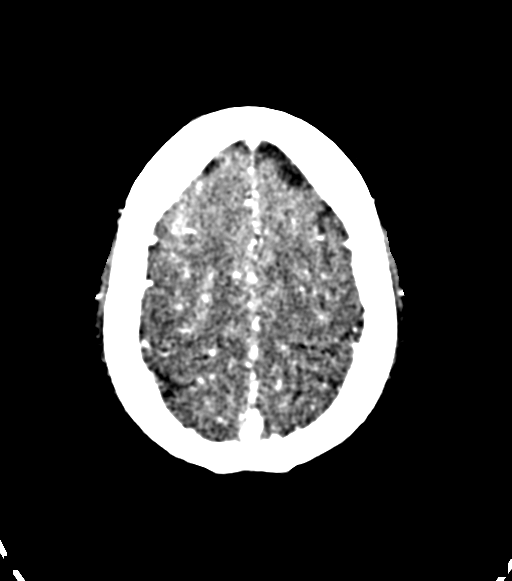
[im 148/171  bone]
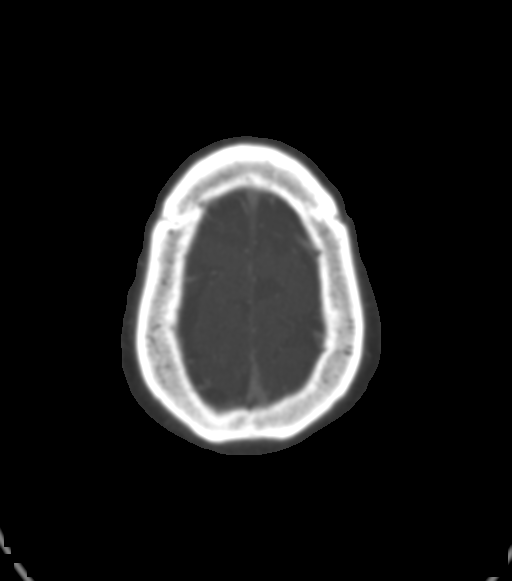
[im 159/171  brain]
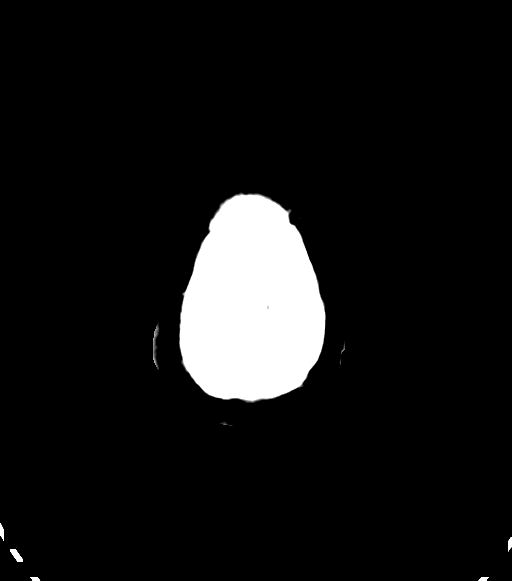

[Series 8: sagittal · sagittal · 0.33mm/px · 3 of 157 slices shown]
[im 32/157  brain]
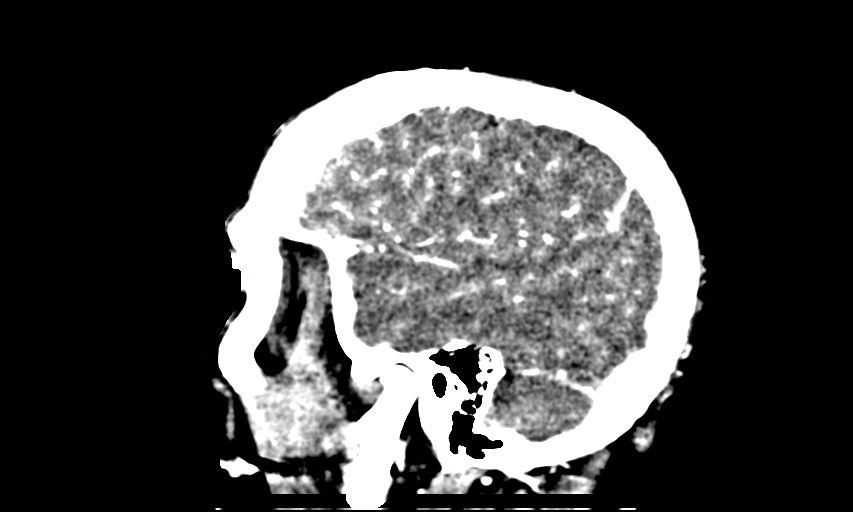
[im 63/157  brain]
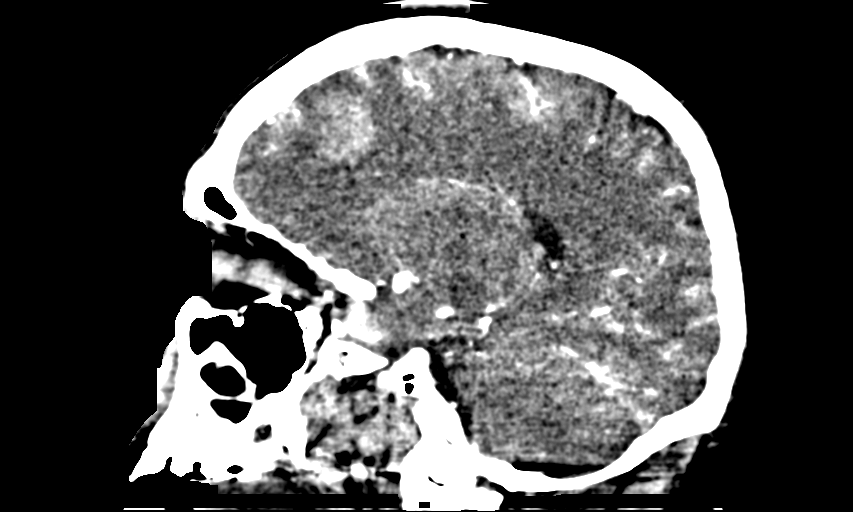
[im 94/157  brain]
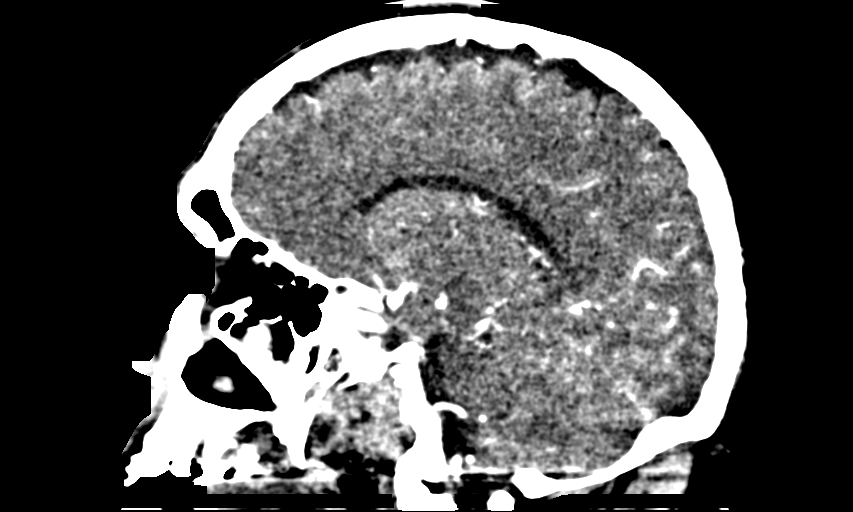

[16 of 37 positions shown; findings below may reference images not displayed]

FINDINGS: There is no enlargement of the right frontal hematoma
measuring 3.1 x 2.4 cm.  Surrounding edema appears the same.  No
significant mass effect or shift.

There is no evidence of high flow arteriovenous malformation.
Along the lateral margin of the right frontal hemorrhage, there are
some slightly prominent spider-like vessels which appear to be
venous.  This could possibly indicate the presence of a low-level
venous malformation, though I do not think we have established that
with certainty.  This could be a luxury perfusion phenomenon.

Both internal carotid arteries are widely patent through the siphon
regions.  No stenosis.  The anterior and middle cerebral vessels
are normal without proximal stenosis or aneurysm.  Both vertebral
arteries are patent to the basilar.  No basilar stenosis.
Posterior circulation branch vessels are normal.

No evidence of venous thrombosis

 Review of the MIP images confirms the above findings.
IMPRESSION: No evidence of high flow arteriovenous malformation.  Grouping of
slightly prominent spider-like vessels along the lateral margin of
the right frontal hemorrhage raising the possibility of a venous
anomaly/venous angioma.  I do not believe this is absolutely
certain however.

## 2015-07-25 ENCOUNTER — Encounter: Payer: Self-pay | Admitting: General Surgery

## 2015-07-25 ENCOUNTER — Ambulatory Visit (INDEPENDENT_AMBULATORY_CARE_PROVIDER_SITE_OTHER): Payer: BLUE CROSS/BLUE SHIELD | Admitting: General Surgery

## 2015-07-25 ENCOUNTER — Other Ambulatory Visit: Payer: Self-pay

## 2015-07-25 VITALS — BP 144/95 | HR 85 | Temp 97.8°F | Wt 243.0 lb

## 2015-07-25 DIAGNOSIS — K4090 Unilateral inguinal hernia, without obstruction or gangrene, not specified as recurrent: Secondary | ICD-10-CM | POA: Diagnosis not present

## 2015-07-25 NOTE — Patient Instructions (Signed)
You are able to use a Truss if you are having some pain.   Please give [place] a call once you decide on a date to do your surgery.  Inguinal Hernia, Adult Muscles help keep everything in the body in its proper place. But if a weak spot in the muscles develops, something can poke through. That is called a hernia. When this happens in the lower part of the belly (abdomen), it is called an inguinal hernia. (It takes its name from a part of the body in this region called the inguinal canal.) A weak spot in the wall of muscles lets some fat or part of the small intestine bulge through. An inguinal hernia can develop at any age. Men get them more often than women. CAUSES  In adults, an inguinal hernia develops over time.  It can be triggered by:  Suddenly straining the muscles of the lower abdomen.  Lifting heavy objects.  Straining to have a bowel movement. Difficult bowel movements (constipation) can lead to this.  Constant coughing. This may be caused by smoking or lung disease.  Being overweight.  Being pregnant.  Working at a job that requires long periods of standing or heavy lifting.  Having had an inguinal hernia before. One type can be an emergency situation. It is called a strangulated inguinal hernia. It develops if part of the small intestine slips through the weak spot and cannot get back into the abdomen. The blood supply can be cut off. If that happens, part of the intestine may die. This situation requires emergency surgery. SYMPTOMS  Often, a small inguinal hernia has no symptoms. It is found when a healthcare provider does a physical exam. Larger hernias usually have symptoms.   In adults, symptoms may include:  A lump in the groin. This is easier to see when the person is standing. It might disappear when lying down.  In men, a lump in the scrotum.  Pain or burning in the groin. This occurs especially when lifting, straining or coughing.  A dull ache or feeling of  pressure in the groin.  Signs of a strangulated hernia can include:  A bulge in the groin that becomes very painful and tender to the touch.  A bulge that turns red or purple.  Fever, nausea and vomiting.  Inability to have a bowel movement or to pass gas. DIAGNOSIS  To decide if you have an inguinal hernia, a healthcare provider will probably do a physical examination.  This will include asking questions about any symptoms you have noticed.  The healthcare provider might feel the groin area and ask you to cough. If an inguinal hernia is felt, the healthcare provider may try to slide it back into the abdomen.  Usually no other tests are needed. TREATMENT  Treatments can vary. The size of the hernia makes a difference. Options include:  Watchful waiting. This is often suggested if the hernia is small and you have had no symptoms.  No medical procedure will be done unless symptoms develop.  You will need to watch closely for symptoms. If any occur, contact your healthcare provider right away.  Surgery. This is used if the hernia is larger or you have symptoms.  Open surgery. This is usually an outpatient procedure (you will not stay overnight in a hospital). An cut (incision) is made through the skin in the groin. The hernia is put back inside the abdomen. The weak area in the muscles is then repaired by herniorrhaphy or hernioplasty.  Herniorrhaphy: in this type of surgery, the weak muscles are sewn back together. Hernioplasty: a patch or mesh is used to close the weak area in the abdominal wall.  Laparoscopy. In this procedure, a surgeon makes small incisions. A thin tube with a tiny video camera (called a laparoscope) is put into the abdomen. The surgeon repairs the hernia with mesh by looking with the video camera and using two long instruments. HOME CARE INSTRUCTIONS   After surgery to repair an inguinal hernia:  You will need to take pain medicine prescribed by your healthcare  provider. Follow all directions carefully.  You will need to take care of the wound from the incision.  Your activity will be restricted for awhile. This will probably include no heavy lifting for several weeks. You also should not do anything too active for a few weeks. When you can return to work will depend on the type of job that you have.  During "watchful waiting" periods, you should:  Maintain a healthy weight.  Eat a diet high in fiber (fruits, vegetables and whole grains).  Drink plenty of fluids to avoid constipation. This means drinking enough water and other liquids to keep your urine clear or pale yellow.  Do not lift heavy objects.  Do not stand for long periods of time.  Quit smoking. This should keep you from developing a frequent cough. SEEK MEDICAL CARE IF:   A bulge develops in your groin area.  You feel pain, a burning sensation or pressure in the groin. This might be worse if you are lifting or straining.  You develop a fever of more than 100.5 F (38.1 C). SEEK IMMEDIATE MEDICAL CARE IF:   Pain in the groin increases suddenly.  A bulge in the groin gets bigger suddenly and does not go down.  For men, there is sudden pain in the scrotum. Or, the size of the scrotum increases.  A bulge in the groin area becomes red or purple and is painful to touch.  You have nausea or vomiting that does not go away.  You feel your heart beating much faster than normal.  You cannot have a bowel movement or pass gas.  You develop a fever of more than 102.0 F (38.9 C).   This information is not intended to replace advice given to you by your health care provider. Make sure you discuss any questions you have with your health care provider.   Document Released: 09/07/2008 Document Revised: 07/14/2011 Document Reviewed: 10/23/2014 Elsevier Interactive Patient Education [employer].

## 2015-07-25 NOTE — Progress Notes (Signed)
Patient ID: Roger [name], male   DOB: Nov 16, 1960, 55 y.o.   MRN: [id number]  CC: Right groin bulge  HPI Roger [name] is a 55 y.o. male who presents to clinic for evaluation of a right groin bulge. Patient states he's noticed that over the last several weeks and is concerned that is a hernia. He has a past history of an umbilical hernia that was repaired open. He also thinks he has a midline area of bulge that is concerned might also be a hernia. He denies any fevers, chills, nausea, vomiting, diarrhea, constipation, chest pain, shortness of breath. He states that he is between medical providers for now does not currently have an established primary care doctor. The right groin bulge has always been soft and reducible. He has pushed back in himself a few times over the last several weeks. He also states that he has been able to urinate fine and otherwise does not have any complaints. He is a Mining engineer.  HPI  Past Medical History  Diagnosis Date  . Anxiety   . Intracerebral hemorrhage (HCC)     Right frontal lobe  . Allergy   . Hypertension   . Kidney stones   . Stroke [facility])     hemorrhagic    Past Surgical History  Procedure Laterality Date  . Hernia repair      Umbilical Hernia    Family History  Problem Relation Age of Onset  . Pancreatic cancer Mother   . Hypertension Mother   . Diabetes Mother   . Obesity Mother     Social History Social History  Substance Use Topics  . Smoking status: Current Every Day Smoker -- 0.50 packs/day for 10 years    Types: Cigarettes  . Smokeless tobacco: None  . Alcohol Use: Yes     Comment: One drink a night    No Known Allergies  Current Outpatient Prescriptions  Medication Sig Dispense Refill  . ALPRAZolam (XANAX) 1 MG tablet Take 1 mg by mouth as needed for anxiety.    Marland Kitchen amphetamine-dextroamphetamine (ADDERALL) 20 MG tablet Take 20 mg by mouth daily.    . pantoprazole (PROTONIX) 40 MG tablet Take 40 mg by mouth  daily.    . traZODone (DESYREL) 100 MG tablet Take 100 mg by mouth at bedtime.     No current facility-administered medications for this visit.     Review of Systems A Multi-point review of systems was asked and was negative except for the findings documented in the history of present illness  Physical Exam Blood pressure 144/95, pulse 85, temperature 97.8 F (36.6 C), temperature source Oral, weight 110.224 kg (243 lb). CONSTITUTIONAL: No acute distress. EYES: Pupils are equal, round, and reactive to light, Sclera are non-icteric. EARS, NOSE, MOUTH AND THROAT: The oropharynx is clear. The oral mucosa is pink and moist. Hearing is intact to voice. LYMPH NODES:  Lymph nodes in the neck are normal. RESPIRATORY:  Lungs are clear. There is normal respiratory effort, with equal breath sounds bilaterally, and without pathologic use of accessory muscles. CARDIOVASCULAR: Heart is regular without murmurs, gallops, or rubs. GI: The abdomen is soft, nontender, and nondistended. There are no palpable masses. There is no hepatosplenomegaly. There are normal bowel sounds in all quadrants. There is a visible diastases recti, a well-healed umbilical hernia incision. There is a soft, easily reducible right inguinal hernia on exam no evidence of left inguinal hernia. GU: Rectal deferred.   MUSCULOSKELETAL: Normal muscle strength and tone. No  cyanosis or edema.   SKIN: Turgor is good and there are no pathologic skin lesions or ulcers. NEUROLOGIC: Motor and sensation is grossly normal. Cranial nerves are grossly intact. PSYCH:  Oriented to person, place and time. Affect is normal.  Data Reviewed No images and labs reviewed. I have personally reviewed the patient's imaging, laboratory findings and medical records.    Assessment    Right inguinal hernia    Plan    55 year old male with a right inguinal hernia. Discussed the problem as well as the possibilities of open versus laparoscopic hernia  repair. Patient is also noted to have very poorly controlled blood pressure. He states he is not currently taking any blood pressure medication however he has had some prescribed past. Given his currently elevated blood pressure and his history of having a stroke discussed that controlling his blood pressure is of the utmost importance. Patient also states that he is so blocked out he is unsure when he can afford to miss 2 weeks for surgery. He will seek care with his new primary care provider that he has an appointment with for better blood pressure control. He will call back to the office when he is ready to schedule his hernia surgery. He is okay with either myself or Dr. Azalee Course performing his surgery      Time spent with the patient was 60 minutes, with more than 50% of the time spent in face-to-face education, counseling and care coordination.     Clayburn Pert, MD FACS General Surgeon 07/25/2015, 3:47 PM

## 2015-08-06 DIAGNOSIS — I639 Cerebral infarction, unspecified: Secondary | ICD-10-CM | POA: Diagnosis not present

## 2015-08-06 DIAGNOSIS — Z7721 Contact with and (suspected) exposure to potentially hazardous body fluids: Secondary | ICD-10-CM | POA: Diagnosis not present

## 2015-08-06 DIAGNOSIS — Z6832 Body mass index (BMI) 32.0-32.9, adult: Secondary | ICD-10-CM | POA: Diagnosis not present

## 2015-08-06 DIAGNOSIS — Z206 Contact with and (suspected) exposure to human immunodeficiency virus [HIV]: Secondary | ICD-10-CM | POA: Diagnosis not present

## 2015-08-06 DIAGNOSIS — I10 Essential (primary) hypertension: Secondary | ICD-10-CM | POA: Diagnosis not present

## 2015-08-06 DIAGNOSIS — K219 Gastro-esophageal reflux disease without esophagitis: Secondary | ICD-10-CM | POA: Diagnosis not present

## 2015-08-29 ENCOUNTER — Ambulatory Visit: Payer: BLUE CROSS/BLUE SHIELD | Admitting: General Surgery

## 2015-08-29 DIAGNOSIS — K4090 Unilateral inguinal hernia, without obstruction or gangrene, not specified as recurrent: Secondary | ICD-10-CM | POA: Diagnosis not present

## 2015-09-07 DIAGNOSIS — Z136 Encounter for screening for cardiovascular disorders: Secondary | ICD-10-CM | POA: Diagnosis not present

## 2015-09-07 DIAGNOSIS — Z0000 Encounter for general adult medical examination without abnormal findings: Secondary | ICD-10-CM | POA: Diagnosis not present

## 2015-09-07 DIAGNOSIS — Z125 Encounter for screening for malignant neoplasm of prostate: Secondary | ICD-10-CM | POA: Diagnosis not present

## 2015-09-10 DIAGNOSIS — Z0000 Encounter for general adult medical examination without abnormal findings: Secondary | ICD-10-CM | POA: Diagnosis not present

## 2015-09-17 ENCOUNTER — Other Ambulatory Visit: Payer: BLUE CROSS/BLUE SHIELD

## 2015-09-17 ENCOUNTER — Encounter: Payer: Self-pay | Admitting: *Deleted

## 2015-09-17 NOTE — Patient Instructions (Signed)
  Your procedure is scheduled on:09/25/15 Report to Day Surgery. [facility]OND [facility]LO[facility] To find out your arrival time please call [phone] between 1PM - 3PM on 09/24/15  Remember: Instructions that are not followed completely may result in serious medical risk, up to and including death, or upon the discretion of your surgeon and anesthesiologist your surgery may need to be rescheduled.    _X___ 1. Do not eat food or drink liquids after midnight. No gum chewing or hard candies.     _X___ 2. No Alcohol for 24 hours before or after surgery.   ____ 3. Bring all medications with you on the day of surgery if instructed.    __X__ 4. Notify your doctor if there is any change in your medical condition     (cold, fever, infections).     Do not wear jewelry, make-up, hairpins, clips or nail polish.  Do not wear lotions, powders, or perfumes. You may wear deodorant.  Do not shave 48 hours prior to surgery. Men may shave face and neck.  Do not bring valuables to the hospital.    [facility] is not responsible for any belongings or valuables.               Contacts, dentures or bridgework may not be worn into surgery.  Leave your suitcase in the car. After surgery it may be brought to your room.  For patients admitted to the hospital, discharge time is determined by your                treatment team.   Patients discharged the day of surgery will not be allowed to drive home.   Please read over the following fact sheets that you were given:   Surgical Site Infection Prevention   _X__ Take these medicines the morning of surgery with A SIP OF WATER:    1.ALPRAZOLAM  2. PROTONIX AT BEDTIME NIGHT BEFORE SURGERY AND AM OF SURGERY  3.   4.  5.  6.  ____ Fleet Enema (as directed)   ____ Use CHG Soap as directed  ____ Use inhalers on the day of surgery  ____ Stop metformin 2 days prior to surgery    ____ Take 1/2 of usual insulin dose the night before surgery and none on the morning  of surgery.   ____ Stop Coumadin/Plavix/aspirin on  ____ Stop Anti-inflammatories on   ____ Stop supplements until after surgery.    _X___ Bring C-Pap to the hospital.

## 2015-09-18 ENCOUNTER — Other Ambulatory Visit: Payer: BLUE CROSS/BLUE SHIELD

## 2015-09-18 DIAGNOSIS — L570 Actinic keratosis: Secondary | ICD-10-CM | POA: Diagnosis not present

## 2015-09-18 DIAGNOSIS — Z85828 Personal history of other malignant neoplasm of skin: Secondary | ICD-10-CM | POA: Diagnosis not present

## 2015-09-18 DIAGNOSIS — D1800 Hemangioma unspecified site: Secondary | ICD-10-CM | POA: Diagnosis not present

## 2015-09-18 DIAGNOSIS — Z1283 Encounter for screening for malignant neoplasm of skin: Secondary | ICD-10-CM | POA: Diagnosis not present

## 2015-09-18 DIAGNOSIS — D229 Melanocytic nevi, unspecified: Secondary | ICD-10-CM | POA: Diagnosis not present

## 2015-09-18 DIAGNOSIS — D485 Neoplasm of uncertain behavior of skin: Secondary | ICD-10-CM | POA: Diagnosis not present

## 2015-09-18 DIAGNOSIS — C44619 Basal cell carcinoma of skin of left upper limb, including shoulder: Secondary | ICD-10-CM | POA: Diagnosis not present

## 2015-09-25 ENCOUNTER — Encounter: Payer: Self-pay | Admitting: *Deleted

## 2015-09-25 ENCOUNTER — Ambulatory Visit
Admission: RE | Admit: 2015-09-25 | Discharge: 2015-09-25 | Disposition: A | Discharge status: Home / Self Care | Payer: BLUE CROSS/BLUE SHIELD | Source: Ambulatory Visit | Attending: Surgery | Admitting: Surgery

## 2015-09-25 ENCOUNTER — Ambulatory Visit: Payer: BLUE CROSS/BLUE SHIELD | Admitting: Anesthesiology

## 2015-09-25 ENCOUNTER — Encounter
Admission: RE | Disposition: A | Discharge status: Home / Self Care | Payer: Self-pay | Source: Ambulatory Visit | Attending: Surgery

## 2015-09-25 DIAGNOSIS — I10 Essential (primary) hypertension: Secondary | ICD-10-CM | POA: Insufficient documentation

## 2015-09-25 DIAGNOSIS — Z8673 Personal history of transient ischemic attack (TIA), and cerebral infarction without residual deficits: Secondary | ICD-10-CM | POA: Insufficient documentation

## 2015-09-25 DIAGNOSIS — K4090 Unilateral inguinal hernia, without obstruction or gangrene, not specified as recurrent: Secondary | ICD-10-CM | POA: Diagnosis not present

## 2015-09-25 DIAGNOSIS — M549 Dorsalgia, unspecified: Secondary | ICD-10-CM | POA: Diagnosis not present

## 2015-09-25 DIAGNOSIS — K219 Gastro-esophageal reflux disease without esophagitis: Secondary | ICD-10-CM | POA: Diagnosis not present

## 2015-09-25 DIAGNOSIS — E669 Obesity, unspecified: Secondary | ICD-10-CM | POA: Diagnosis not present

## 2015-09-25 DIAGNOSIS — G4730 Sleep apnea, unspecified: Secondary | ICD-10-CM | POA: Insufficient documentation

## 2015-09-25 DIAGNOSIS — Z87442 Personal history of urinary calculi: Secondary | ICD-10-CM | POA: Insufficient documentation

## 2015-09-25 DIAGNOSIS — Z79899 Other long term (current) drug therapy: Secondary | ICD-10-CM | POA: Diagnosis not present

## 2015-09-25 DIAGNOSIS — F419 Anxiety disorder, unspecified: Secondary | ICD-10-CM | POA: Diagnosis not present

## 2015-09-25 DIAGNOSIS — Z87891 Personal history of nicotine dependence: Secondary | ICD-10-CM | POA: Diagnosis not present

## 2015-09-25 DIAGNOSIS — F319 Bipolar disorder, unspecified: Secondary | ICD-10-CM | POA: Insufficient documentation

## 2015-09-25 DIAGNOSIS — G4700 Insomnia, unspecified: Secondary | ICD-10-CM | POA: Insufficient documentation

## 2015-09-25 DIAGNOSIS — G8929 Other chronic pain: Secondary | ICD-10-CM | POA: Diagnosis not present

## 2015-09-25 DIAGNOSIS — Z6836 Body mass index (BMI) 36.0-36.9, adult: Secondary | ICD-10-CM | POA: Insufficient documentation

## 2015-09-25 HISTORY — DX: Major depressive disorder, single episode, unspecified: F32.9

## 2015-09-25 HISTORY — PX: INGUINAL HERNIA REPAIR: SHX194

## 2015-09-25 HISTORY — DX: Sleep apnea, unspecified: G47.30

## 2015-09-25 HISTORY — DX: Pain, unspecified: R52

## 2015-09-25 HISTORY — DX: Depression, unspecified: F32.A

## 2015-09-25 HISTORY — DX: Other specified behavioral and emotional disorders with onset usually occurring in childhood and adolescence: F98.8

## 2015-09-25 HISTORY — DX: Bipolar disorder, unspecified: F31.9

## 2015-09-25 HISTORY — DX: Gastro-esophageal reflux disease without esophagitis: K21.9

## 2015-09-25 HISTORY — DX: Unspecified osteoarthritis, unspecified site: M19.90

## 2015-09-25 LAB — URINE DRUG SCREEN, QUALITATIVE (ARMC ONLY)
AMPHETAMINES, UR SCREEN: POSITIVE — AB
BENZODIAZEPINE, UR SCRN: POSITIVE — AB
Barbiturates, Ur Screen: NOT DETECTED
Cannabinoid 50 Ng, Ur ~~LOC~~: POSITIVE — AB
Cocaine Metabolite,Ur ~~LOC~~: NOT DETECTED
MDMA (ECSTASY) UR SCREEN: NOT DETECTED
METHADONE SCREEN, URINE: NOT DETECTED
Opiate, Ur Screen: NOT DETECTED
Phencyclidine (PCP) Ur S: NOT DETECTED
Tricyclic, Ur Screen: NOT DETECTED

## 2015-09-25 SURGERY — REPAIR, HERNIA, INGUINAL, ADULT
Anesthesia: General | Site: Abdomen | Laterality: Right | Wound class: Clean

## 2015-09-25 MED ORDER — ROCURONIUM BROMIDE 100 MG/10ML IV SOLN
INTRAVENOUS | Status: DC | PRN
  Administered 2015-09-25: 10 mg via INTRAVENOUS
  Administered 2015-09-25: 30 mg via INTRAVENOUS
  Administered 2015-09-25: 10 mg via INTRAVENOUS
  Administered 2015-09-25: 20 mg via INTRAVENOUS

## 2015-09-25 MED ORDER — ONDANSETRON HCL 4 MG/2ML IJ SOLN: 4.0000 mg | Freq: Once | INTRAMUSCULAR | Status: DC | PRN

## 2015-09-25 MED ORDER — FENTANYL CITRATE (PF) 100 MCG/2ML IJ SOLN
25.0000 ug | INTRAMUSCULAR | Status: DC | PRN
  Administered 2015-09-25 (×2): 25 ug via INTRAVENOUS

## 2015-09-25 MED ORDER — HYDROCODONE-ACETAMINOPHEN 5-325 MG PO TABS: 1.0000 | ORAL_TABLET | ORAL | Status: DC | PRN

## 2015-09-25 MED ORDER — PROPOFOL 10 MG/ML IV BOLUS
INTRAVENOUS | Status: DC | PRN
  Administered 2015-09-25: 200 mg via INTRAVENOUS

## 2015-09-25 MED ORDER — ACETAMINOPHEN 10 MG/ML IV SOLN
INTRAVENOUS | Status: DC | PRN
  Administered 2015-09-25: 1000 mg via INTRAVENOUS

## 2015-09-25 MED ORDER — SUGAMMADEX SODIUM 200 MG/2ML IV SOLN
INTRAVENOUS | Status: DC | PRN
  Administered 2015-09-25: 218.6 mg via INTRAVENOUS

## 2015-09-25 MED ORDER — ACETAMINOPHEN 10 MG/ML IV SOLN
INTRAVENOUS | Status: AC
  Filled 2015-09-25: qty 100

## 2015-09-25 MED ORDER — LACTATED RINGERS IV SOLN
INTRAVENOUS | Status: DC
  Administered 2015-09-25 (×2): via INTRAVENOUS

## 2015-09-25 MED ORDER — LIDOCAINE HCL (CARDIAC) 20 MG/ML IV SOLN
INTRAVENOUS | Status: DC | PRN
  Administered 2015-09-25: 80 mg via INTRAVENOUS

## 2015-09-25 MED ORDER — BUPIVACAINE-EPINEPHRINE (PF) 0.5% -1:200000 IJ SOLN
INTRAMUSCULAR | Status: DC | PRN
  Administered 2015-09-25: 22 mL via PERINEURAL

## 2015-09-25 MED ORDER — HYDROCODONE-ACETAMINOPHEN 5-325 MG PO TABS: 1.0000 | ORAL_TABLET | ORAL | Status: AC | PRN

## 2015-09-25 MED ORDER — CEFAZOLIN SODIUM-DEXTROSE 2-4 GM/100ML-% IV SOLN
2.0000 g | INTRAVENOUS | Status: AC
  Administered 2015-09-25: 2 g via INTRAVENOUS

## 2015-09-25 MED ORDER — FENTANYL CITRATE (PF) 100 MCG/2ML IJ SOLN
INTRAMUSCULAR | Status: DC | PRN
  Administered 2015-09-25 (×2): 100 ug via INTRAVENOUS
  Administered 2015-09-25: 50 ug via INTRAVENOUS

## 2015-09-25 MED ORDER — ONDANSETRON HCL 4 MG/2ML IJ SOLN
INTRAMUSCULAR | Status: DC | PRN
  Administered 2015-09-25: 4 mg via INTRAVENOUS

## 2015-09-25 MED ORDER — GLYCOPYRROLATE 0.2 MG/ML IJ SOLN
INTRAMUSCULAR | Status: DC | PRN
  Administered 2015-09-25: 0.2 mg via INTRAVENOUS

## 2015-09-25 MED ORDER — CEFAZOLIN SODIUM-DEXTROSE 2-4 GM/100ML-% IV SOLN
INTRAVENOUS | Status: AC
  Administered 2015-09-25: 2 g via INTRAVENOUS
  Filled 2015-09-25: qty 100

## 2015-09-25 MED ORDER — BUPIVACAINE-EPINEPHRINE (PF) 0.5% -1:200000 IJ SOLN
INTRAMUSCULAR | Status: AC
  Filled 2015-09-25: qty 30

## 2015-09-25 MED ORDER — DEXAMETHASONE SODIUM PHOSPHATE 10 MG/ML IJ SOLN
INTRAMUSCULAR | Status: DC | PRN
  Administered 2015-09-25: 8 mg via INTRAVENOUS

## 2015-09-25 MED ORDER — FENTANYL CITRATE (PF) 100 MCG/2ML IJ SOLN
INTRAMUSCULAR | Status: AC
  Administered 2015-09-25: 25 ug via INTRAVENOUS
  Filled 2015-09-25: qty 2

## 2015-09-25 MED ORDER — SUCCINYLCHOLINE CHLORIDE 20 MG/ML IJ SOLN
INTRAMUSCULAR | Status: DC | PRN
  Administered 2015-09-25: 100 mg via INTRAVENOUS

## 2015-09-25 MED ORDER — MIDAZOLAM HCL 5 MG/5ML IJ SOLN
INTRAMUSCULAR | Status: DC | PRN
  Administered 2015-09-25: 2 mg via INTRAVENOUS

## 2015-09-25 SURGICAL SUPPLY — 25 items
BLADE SURG 15 STRL LF DISP TIS (BLADE) ×1 IMPLANT
BLADE SURG 15 STRL SS (BLADE) ×1
CANISTER SUCT 1200ML W/VALVE (MISCELLANEOUS) ×2 IMPLANT
CHLORAPREP W/TINT 26ML (MISCELLANEOUS) ×2 IMPLANT
DRAIN PENROSE 5/8X18 LTX STRL (WOUND CARE) ×2 IMPLANT
DRAPE LAPAROTOMY 77X122 PED (DRAPES) ×2 IMPLANT
ELECT REM PT RETURN 9FT ADLT (ELECTROSURGICAL) ×2
ELECTRODE REM PT RTRN 9FT ADLT (ELECTROSURGICAL) ×1 IMPLANT
GLOVE BIO SURGEON STRL SZ7.5 (GLOVE) ×2 IMPLANT
GOWN STRL REUS W/ TWL LRG LVL3 (GOWN DISPOSABLE) ×3 IMPLANT
GOWN STRL REUS W/TWL LRG LVL3 (GOWN DISPOSABLE) ×3
KIT RM TURNOVER STRD PROC AR (KITS) ×2 IMPLANT
LABEL OR SOLS (LABEL) ×2 IMPLANT
LIQUID BAND (GAUZE/BANDAGES/DRESSINGS) ×2 IMPLANT
MESH SYNTHETIC 4X6 SOFT BARD (Mesh General) ×1 IMPLANT
MESH SYNTHETIC SOFT BARD 4X6 (Mesh General) ×1 IMPLANT
NEEDLE HYPO 25X1 1.5 SAFETY (NEEDLE) ×2 IMPLANT
NS IRRIG 500ML POUR BTL (IV SOLUTION) ×2 IMPLANT
PACK BASIN MINOR ARMC (MISCELLANEOUS) ×2 IMPLANT
SUT CHROMIC 4 0 RB 1X27 (SUTURE) ×2 IMPLANT
SUT MNCRL AB 4-0 PS2 18 (SUTURE) ×2 IMPLANT
SUT SURGILON 0 30 BLK (SUTURE) ×10 IMPLANT
SUT VIC AB 4-0 SH 27 (SUTURE) ×2
SUT VIC AB 4-0 SH 27XANBCTRL (SUTURE) ×2 IMPLANT
SYRINGE 10CC LL (SYRINGE) ×2 IMPLANT

## 2015-09-25 NOTE — H&P (Signed)
  He reports no change in condition since office exam.  Labs noted.  Discussed plan for inguinal hernia repair and marked right side YES

## 2015-09-25 NOTE — Anesthesia Preprocedure Evaluation (Signed)
Anesthesia Evaluation  Patient identified by MRN, date of birth, ID band Patient awake    Reviewed: Allergy & Precautions, NPO status , Patient's Chart, lab work & pertinent test results  Airway Mallampati: II       Dental  (+) Teeth Intact, Caps, Implants   Pulmonary sleep apnea , former smoker,     + decreased breath sounds      Cardiovascular Exercise Tolerance: Good hypertension, Pt. on medications  Rhythm:Regular Rate:Normal     Neuro/Psych Anxiety CVA    GI/Hepatic Neg liver ROS, GERD  ,  Endo/Other    Renal/GU      Musculoskeletal   Abdominal (+) + obese,   Peds  Hematology   Anesthesia Other Findings   Reproductive/Obstetrics                             Anesthesia Physical Anesthesia Plan  ASA: III  Anesthesia Plan: General   Post-op Pain Management:    Induction: Intravenous  Airway Management Planned: LMA and Oral ETT  Additional Equipment:   Intra-op Plan:   Post-operative Plan: Extubation in OR  Informed Consent: I have reviewed the patients History and Physical, chart, labs and discussed the procedure including the risks, benefits and alternatives for the proposed anesthesia with the patient or authorized representative who has indicated his/her understanding and acceptance.     Plan Discussed with: CRNA  Anesthesia Plan Comments:         Anesthesia Quick Evaluation

## 2015-09-25 NOTE — Op Note (Signed)
OPERATIVE REPORT  PREOPERATIVE DIAGNOSIS: right inguinal hernia  POSTOPERATIVE DIAGNOSIS:right  inguinal hernia  PROCEDURE:  right inguinal hernia repair  ANESTHESIA:  General  SURGEON:  Rochel Brome M.D.  INDICATIONS: He reports a history of bulging in the right groin. A right inguinal hernia was demonstrated on physical exam and repair was recommended for definitive treatment.   With the patient on the operating table in the supine position the right lower quadrant was prepared with clippers and with ChloraPrep and draped in a sterile manner. A transversely oriented suprapubic incision was made and carried down through subcutaneous tissues. One traversing vein was divided between 4-0 chromic suture ligatures. Electrocautery was used for hemostasis. The Scarpa's fascia was incised. The external oblique aponeurosis was incised along the course of its fibers to open the external ring and expose the inguinal cord structures. The cord structures were mobilized. A Penrose drain was passed around the cord structures for traction. Cremaster fibers were separated to expose an indirect hernia sac which was dissected free from surrounding structures the sac was some 8 cm in length. It was a sliding hernia made up of bowel wall this was reduced back into the abdominal cavity. The floor of the inguinal canal was repaired with 0 Surgilon sutures suturing the conjoined tendon to the shelving edge of the inguinal ligament incorporating transversalis fascia into the repair. Bard soft mesh was cut to create an oval shape and was placed over the repair. This was sutured to the repair with interrupted 0 Surgilon sutures and also sutured medially to the deep fascia and on both sides of the internal ring. Next after seeing hemostasis was intact the cord structures were replaced along the floor of the inguinal canal. The cut edges of the external oblique aponeurosis were closed with a running 4-0 Vicryl suture to  re-create the external ring. The deep fascia superior and lateral to the repair site was infiltrated with half percent Sensorcaine with epinephrine. Subcutaneous tissues were also infiltrated. The Scarpa's fascia was closed with interrupted 4-0 Vicryl sutures. The skin was closed with running 4-0 Monocryl subcuticular suture and LiquiBand. The testicle remained in the scrotum  The patient appeared to be in satisfactory condition and was prepared for transfer to the recovery room.  Rochel Brome M.D.

## 2015-09-25 NOTE — Anesthesia Procedure Notes (Signed)
Procedure Name: Intubation Date/Time: 09/25/2015 7:38 AM Performed by: Delaney Meigs Pre-anesthesia Checklist: Patient identified, Emergency Drugs available, Suction available, Patient being monitored and Timeout performed Patient Re-evaluated:Patient Re-evaluated prior to inductionOxygen Delivery Method: Circle system utilized Preoxygenation: Pre-oxygenation with 100% oxygen Intubation Type: IV induction Ventilation: Mask ventilation without difficulty Laryngoscope Size: Mac and 3 Grade View: Grade II Tube type: Oral Tube size: 7.0 mm Number of attempts: 1 Airway Equipment and Method: Stylet Placement Confirmation: ETT inserted through vocal cords under direct vision,  positive ETCO2 and breath sounds checked- equal and bilateral Secured at: 22 cm Tube secured with: Tape Dental Injury: Teeth and Oropharynx as per pre-operative assessment

## 2015-09-25 NOTE — Anesthesia Postprocedure Evaluation (Signed)
Anesthesia Post Note  Patient: Roger [name]  Procedure(s) Performed: Procedure(s) (LRB): HERNIA REPAIR INGUINAL ADULT (Right)  Patient location during evaluation: PACU Anesthesia Type: General Level of consciousness: awake Pain management: pain level controlled Vital Signs Assessment: post-procedure vital signs reviewed and stable Respiratory status: spontaneous breathing Cardiovascular status: stable Anesthetic complications: no    Last Vitals:  Filed Vitals:   09/25/15 0628 09/25/15 0920  BP: 105/80 130/82  Pulse: 68 73  Temp: 36.9 C 36.6 C  Resp: 20 21    Last Pain:  Filed Vitals:   09/25/15 0927  PainSc: Asleep                 VAN STAVEREN,Eavan Gonterman

## 2015-09-25 NOTE — Discharge Instructions (Signed)
Take Tylenol or Norco if needed for pain.May shower. Avoid straining and heavy lifting   AMBULATORY SURGERY  DISCHARGE INSTRUCTIONS   1) The drugs that you were given will stay in your system until tomorrow so for the next 24 hours you should not:  A) Drive an automobile B) Make any legal decisions C) Drink any alcoholic beverage   2) You may resume regular meals tomorrow.  Today it is better to start with liquids and gradually work up to solid foods.  You may eat anything you prefer, but it is better to start with liquids, then soup and crackers, and gradually work up to solid foods.   3) Please notify your doctor immediately if you have any unusual bleeding, trouble breathing, redness and pain at the surgery site, drainage, fever, or pain not relieved by medication.    4) Additional Instructions:        Please contact your physician with any problems or Same Day Surgery at [phone], Monday through Friday 6 am to 4 pm, or [facility] number at [phone]. 

## 2015-09-25 NOTE — Transfer of Care (Signed)
Immediate Anesthesia Transfer of Care Note  Patient: Roger [name]  Procedure(s) Performed: Procedure(s): HERNIA REPAIR INGUINAL ADULT (Right)  Patient Location: PACU  Anesthesia Type:General  Level of Consciousness: sedated  Airway & Oxygen Therapy: Patient Spontanous Breathing and Patient connected to face mask oxygen  Post-op Assessment: Report given to RN and Post -op Vital signs reviewed and stable  Post vital signs: Reviewed and stable  Last Vitals: 97.8 temp 75 hr 100% sat 130/82 21 resp  Filed Vitals:   09/25/15 0628  BP: 105/80  Pulse: 68  Temp: 36.9 C  Resp: 20    Last Pain: There were no vitals filed for this visit.    Patients Stated Pain Goal: 2 ([id number])  Complications: No apparent anesthesia complications

## 2015-09-26 ENCOUNTER — Encounter: Payer: Self-pay | Admitting: Surgery

## 2015-10-02 DIAGNOSIS — C44619 Basal cell carcinoma of skin of left upper limb, including shoulder: Secondary | ICD-10-CM | POA: Diagnosis not present

## 2015-11-05 DIAGNOSIS — M79645 Pain in left finger(s): Secondary | ICD-10-CM | POA: Diagnosis not present

## 2015-11-15 DIAGNOSIS — G4733 Obstructive sleep apnea (adult) (pediatric): Secondary | ICD-10-CM | POA: Diagnosis not present

## 2015-12-03 DIAGNOSIS — M79672 Pain in left foot: Secondary | ICD-10-CM | POA: Diagnosis not present

## 2015-12-03 DIAGNOSIS — M722 Plantar fascial fibromatosis: Secondary | ICD-10-CM | POA: Diagnosis not present

## 2015-12-03 DIAGNOSIS — M25572 Pain in left ankle and joints of left foot: Secondary | ICD-10-CM | POA: Diagnosis not present

## 2015-12-17 DIAGNOSIS — I639 Cerebral infarction, unspecified: Secondary | ICD-10-CM | POA: Diagnosis not present

## 2015-12-17 DIAGNOSIS — K219 Gastro-esophageal reflux disease without esophagitis: Secondary | ICD-10-CM | POA: Diagnosis not present

## 2015-12-17 DIAGNOSIS — F419 Anxiety disorder, unspecified: Secondary | ICD-10-CM | POA: Diagnosis not present

## 2015-12-17 DIAGNOSIS — I10 Essential (primary) hypertension: Secondary | ICD-10-CM | POA: Diagnosis not present

## 2015-12-24 DIAGNOSIS — M722 Plantar fascial fibromatosis: Secondary | ICD-10-CM | POA: Diagnosis not present

## 2016-01-21 DIAGNOSIS — F410 Panic disorder [episodic paroxysmal anxiety] without agoraphobia: Secondary | ICD-10-CM | POA: Diagnosis not present

## 2016-01-21 DIAGNOSIS — G4709 Other insomnia: Secondary | ICD-10-CM | POA: Diagnosis not present

## 2016-01-21 DIAGNOSIS — F900 Attention-deficit hyperactivity disorder, predominantly inattentive type: Secondary | ICD-10-CM | POA: Diagnosis not present

## 2016-02-02 DIAGNOSIS — Z23 Encounter for immunization: Secondary | ICD-10-CM | POA: Diagnosis not present

## 2016-02-11 DIAGNOSIS — M79672 Pain in left foot: Secondary | ICD-10-CM | POA: Diagnosis not present

## 2016-02-11 DIAGNOSIS — M722 Plantar fascial fibromatosis: Secondary | ICD-10-CM | POA: Diagnosis not present

## 2016-02-25 DIAGNOSIS — F988 Other specified behavioral and emotional disorders with onset usually occurring in childhood and adolescence: Secondary | ICD-10-CM | POA: Diagnosis not present

## 2016-02-25 DIAGNOSIS — I10 Essential (primary) hypertension: Secondary | ICD-10-CM | POA: Diagnosis not present

## 2016-02-25 DIAGNOSIS — F411 Generalized anxiety disorder: Secondary | ICD-10-CM | POA: Diagnosis not present

## 2016-02-25 DIAGNOSIS — G4733 Obstructive sleep apnea (adult) (pediatric): Secondary | ICD-10-CM | POA: Diagnosis not present

## 2016-03-10 DIAGNOSIS — M722 Plantar fascial fibromatosis: Secondary | ICD-10-CM | POA: Diagnosis not present

## 2016-03-10 DIAGNOSIS — M79672 Pain in left foot: Secondary | ICD-10-CM | POA: Diagnosis not present

## 2016-03-13 DIAGNOSIS — G4733 Obstructive sleep apnea (adult) (pediatric): Secondary | ICD-10-CM | POA: Diagnosis not present

## 2016-03-25 DIAGNOSIS — D485 Neoplasm of uncertain behavior of skin: Secondary | ICD-10-CM | POA: Diagnosis not present

## 2016-03-25 DIAGNOSIS — L578 Other skin changes due to chronic exposure to nonionizing radiation: Secondary | ICD-10-CM | POA: Diagnosis not present

## 2016-03-25 DIAGNOSIS — Z1283 Encounter for screening for malignant neoplasm of skin: Secondary | ICD-10-CM | POA: Diagnosis not present

## 2016-03-25 DIAGNOSIS — L820 Inflamed seborrheic keratosis: Secondary | ICD-10-CM | POA: Diagnosis not present

## 2016-03-25 DIAGNOSIS — Z85828 Personal history of other malignant neoplasm of skin: Secondary | ICD-10-CM | POA: Diagnosis not present

## 2016-04-07 DIAGNOSIS — M722 Plantar fascial fibromatosis: Secondary | ICD-10-CM | POA: Diagnosis not present

## 2016-04-07 DIAGNOSIS — M79672 Pain in left foot: Secondary | ICD-10-CM | POA: Diagnosis not present

## 2016-04-07 DIAGNOSIS — G8929 Other chronic pain: Secondary | ICD-10-CM | POA: Diagnosis not present

## 2016-04-14 DIAGNOSIS — M47816 Spondylosis without myelopathy or radiculopathy, lumbar region: Secondary | ICD-10-CM | POA: Diagnosis not present

## 2016-04-14 DIAGNOSIS — M25562 Pain in left knee: Secondary | ICD-10-CM | POA: Diagnosis not present

## 2016-04-14 DIAGNOSIS — M545 Low back pain: Secondary | ICD-10-CM | POA: Diagnosis not present

## 2016-04-14 DIAGNOSIS — G8929 Other chronic pain: Secondary | ICD-10-CM | POA: Diagnosis not present

## 2016-04-14 DIAGNOSIS — M4686 Other specified inflammatory spondylopathies, lumbar region: Secondary | ICD-10-CM | POA: Diagnosis not present

## 2016-07-14 DIAGNOSIS — F900 Attention-deficit hyperactivity disorder, predominantly inattentive type: Secondary | ICD-10-CM | POA: Diagnosis not present

## 2016-07-14 DIAGNOSIS — F4322 Adjustment disorder with anxiety: Secondary | ICD-10-CM | POA: Diagnosis not present

## 2016-07-28 DIAGNOSIS — G4733 Obstructive sleep apnea (adult) (pediatric): Secondary | ICD-10-CM | POA: Diagnosis not present

## 2016-08-18 DIAGNOSIS — M150 Primary generalized (osteo)arthritis: Secondary | ICD-10-CM | POA: Diagnosis not present

## 2016-09-23 DIAGNOSIS — L578 Other skin changes due to chronic exposure to nonionizing radiation: Secondary | ICD-10-CM | POA: Diagnosis not present

## 2016-09-23 DIAGNOSIS — Z1283 Encounter for screening for malignant neoplasm of skin: Secondary | ICD-10-CM | POA: Diagnosis not present

## 2016-09-23 DIAGNOSIS — Z85828 Personal history of other malignant neoplasm of skin: Secondary | ICD-10-CM | POA: Diagnosis not present

## 2016-09-23 DIAGNOSIS — D485 Neoplasm of uncertain behavior of skin: Secondary | ICD-10-CM | POA: Diagnosis not present

## 2016-09-23 DIAGNOSIS — D229 Melanocytic nevi, unspecified: Secondary | ICD-10-CM | POA: Diagnosis not present

## 2016-09-23 DIAGNOSIS — D225 Melanocytic nevi of trunk: Secondary | ICD-10-CM | POA: Diagnosis not present

## 2016-11-11 DIAGNOSIS — D225 Melanocytic nevi of trunk: Secondary | ICD-10-CM | POA: Diagnosis not present

## 2016-11-11 DIAGNOSIS — D485 Neoplasm of uncertain behavior of skin: Secondary | ICD-10-CM | POA: Diagnosis not present

## 2016-11-18 DIAGNOSIS — D229 Melanocytic nevi, unspecified: Secondary | ICD-10-CM | POA: Diagnosis not present

## 2016-11-18 DIAGNOSIS — D225 Melanocytic nevi of trunk: Secondary | ICD-10-CM | POA: Diagnosis not present

## 2016-12-15 ENCOUNTER — Other Ambulatory Visit: Payer: Self-pay | Admitting: Family Medicine

## 2016-12-15 ENCOUNTER — Ambulatory Visit
Admission: RE | Admit: 2016-12-15 | Discharge: 2016-12-15 | Disposition: A | Discharge status: Home / Self Care | Payer: BLUE CROSS/BLUE SHIELD | Source: Ambulatory Visit | Attending: Family Medicine | Admitting: Family Medicine

## 2016-12-15 DIAGNOSIS — M19012 Primary osteoarthritis, left shoulder: Secondary | ICD-10-CM | POA: Diagnosis not present

## 2016-12-15 DIAGNOSIS — M25512 Pain in left shoulder: Secondary | ICD-10-CM

## 2017-01-01 DIAGNOSIS — M7542 Impingement syndrome of left shoulder: Secondary | ICD-10-CM | POA: Diagnosis not present

## 2017-01-01 DIAGNOSIS — M25512 Pain in left shoulder: Secondary | ICD-10-CM | POA: Diagnosis not present

## 2017-01-12 DIAGNOSIS — F900 Attention-deficit hyperactivity disorder, predominantly inattentive type: Secondary | ICD-10-CM | POA: Diagnosis not present

## 2017-01-12 DIAGNOSIS — F411 Generalized anxiety disorder: Secondary | ICD-10-CM | POA: Diagnosis not present

## 2017-01-23 DIAGNOSIS — M7542 Impingement syndrome of left shoulder: Secondary | ICD-10-CM | POA: Diagnosis not present

## 2017-03-09 DIAGNOSIS — Z0000 Encounter for general adult medical examination without abnormal findings: Secondary | ICD-10-CM | POA: Diagnosis not present

## 2017-03-09 DIAGNOSIS — I10 Essential (primary) hypertension: Secondary | ICD-10-CM | POA: Diagnosis not present

## 2017-03-09 DIAGNOSIS — Z131 Encounter for screening for diabetes mellitus: Secondary | ICD-10-CM | POA: Diagnosis not present

## 2017-03-09 DIAGNOSIS — Z125 Encounter for screening for malignant neoplasm of prostate: Secondary | ICD-10-CM | POA: Diagnosis not present

## 2017-03-09 DIAGNOSIS — Z136 Encounter for screening for cardiovascular disorders: Secondary | ICD-10-CM | POA: Diagnosis not present

## 2017-03-09 DIAGNOSIS — Z6830 Body mass index (BMI) 30.0-30.9, adult: Secondary | ICD-10-CM | POA: Diagnosis not present

## 2017-03-10 ENCOUNTER — Other Ambulatory Visit: Payer: Self-pay | Admitting: Family Medicine

## 2017-03-10 DIAGNOSIS — M25512 Pain in left shoulder: Secondary | ICD-10-CM

## 2017-03-10 DIAGNOSIS — G8929 Other chronic pain: Secondary | ICD-10-CM

## 2017-03-23 ENCOUNTER — Ambulatory Visit
Admission: RE | Admit: 2017-03-23 | Discharge: 2017-03-23 | Disposition: A | Discharge status: Home / Self Care | Payer: BLUE CROSS/BLUE SHIELD | Source: Ambulatory Visit | Attending: Family Medicine | Admitting: Family Medicine

## 2017-03-23 DIAGNOSIS — G8929 Other chronic pain: Secondary | ICD-10-CM

## 2017-03-23 DIAGNOSIS — R600 Localized edema: Secondary | ICD-10-CM | POA: Diagnosis not present

## 2017-03-23 DIAGNOSIS — M25512 Pain in left shoulder: Secondary | ICD-10-CM

## 2017-03-31 DIAGNOSIS — D485 Neoplasm of uncertain behavior of skin: Secondary | ICD-10-CM | POA: Diagnosis not present

## 2017-03-31 DIAGNOSIS — Z1283 Encounter for screening for malignant neoplasm of skin: Secondary | ICD-10-CM | POA: Diagnosis not present

## 2017-03-31 DIAGNOSIS — Z85828 Personal history of other malignant neoplasm of skin: Secondary | ICD-10-CM | POA: Diagnosis not present

## 2017-03-31 DIAGNOSIS — L219 Seborrheic dermatitis, unspecified: Secondary | ICD-10-CM | POA: Diagnosis not present

## 2017-07-01 DIAGNOSIS — L219 Seborrheic dermatitis, unspecified: Secondary | ICD-10-CM | POA: Diagnosis not present

## 2017-07-06 DIAGNOSIS — F900 Attention-deficit hyperactivity disorder, predominantly inattentive type: Secondary | ICD-10-CM | POA: Diagnosis not present

## 2017-10-13 DIAGNOSIS — D485 Neoplasm of uncertain behavior of skin: Secondary | ICD-10-CM | POA: Diagnosis not present

## 2017-10-13 DIAGNOSIS — Z1283 Encounter for screening for malignant neoplasm of skin: Secondary | ICD-10-CM | POA: Diagnosis not present

## 2017-10-13 DIAGNOSIS — L570 Actinic keratosis: Secondary | ICD-10-CM | POA: Diagnosis not present

## 2017-10-13 DIAGNOSIS — D2260 Melanocytic nevi of unspecified upper limb, including shoulder: Secondary | ICD-10-CM | POA: Diagnosis not present

## 2017-10-13 DIAGNOSIS — L820 Inflamed seborrheic keratosis: Secondary | ICD-10-CM | POA: Diagnosis not present

## 2017-10-13 DIAGNOSIS — Z85828 Personal history of other malignant neoplasm of skin: Secondary | ICD-10-CM | POA: Diagnosis not present

## 2017-11-03 ENCOUNTER — Other Ambulatory Visit: Payer: Self-pay | Admitting: General Surgery

## 2017-11-03 DIAGNOSIS — R1031 Right lower quadrant pain: Secondary | ICD-10-CM

## 2017-11-03 DIAGNOSIS — R100 Acute abdomen: Secondary | ICD-10-CM | POA: Diagnosis not present

## 2017-11-17 ENCOUNTER — Ambulatory Visit
Admission: RE | Admit: 2017-11-17 | Discharge: 2017-11-17 | Disposition: A | Discharge status: Home / Self Care | Payer: BLUE CROSS/BLUE SHIELD | Source: Ambulatory Visit | Attending: General Surgery | Admitting: General Surgery

## 2017-11-17 DIAGNOSIS — R1031 Right lower quadrant pain: Secondary | ICD-10-CM | POA: Diagnosis not present

## 2017-11-17 MED ORDER — IOPAMIDOL (ISOVUE-300) INJECTION 61%
100.0000 mL | Freq: Once | INTRAVENOUS | Status: AC | PRN
  Administered 2017-11-17: 100 mL via INTRAVENOUS

## 2017-11-23 ENCOUNTER — Other Ambulatory Visit: Payer: BLUE CROSS/BLUE SHIELD

## 2017-12-29 DIAGNOSIS — L219 Seborrheic dermatitis, unspecified: Secondary | ICD-10-CM | POA: Diagnosis not present

## 2017-12-29 DIAGNOSIS — L400 Psoriasis vulgaris: Secondary | ICD-10-CM | POA: Diagnosis not present

## 2018-01-11 DIAGNOSIS — F900 Attention-deficit hyperactivity disorder, predominantly inattentive type: Secondary | ICD-10-CM | POA: Diagnosis not present

## 2018-01-18 DIAGNOSIS — G5631 Lesion of radial nerve, right upper limb: Secondary | ICD-10-CM | POA: Diagnosis not present

## 2018-03-29 DIAGNOSIS — I10 Essential (primary) hypertension: Secondary | ICD-10-CM | POA: Diagnosis not present

## 2018-03-29 DIAGNOSIS — Z125 Encounter for screening for malignant neoplasm of prostate: Secondary | ICD-10-CM | POA: Diagnosis not present

## 2018-03-29 DIAGNOSIS — Z13220 Encounter for screening for lipoid disorders: Secondary | ICD-10-CM | POA: Diagnosis not present

## 2018-03-29 DIAGNOSIS — Z0000 Encounter for general adult medical examination without abnormal findings: Secondary | ICD-10-CM | POA: Diagnosis not present

## 2018-05-10 DIAGNOSIS — D2372 Other benign neoplasm of skin of left lower limb, including hip: Secondary | ICD-10-CM | POA: Diagnosis not present

## 2018-05-10 DIAGNOSIS — L820 Inflamed seborrheic keratosis: Secondary | ICD-10-CM | POA: Diagnosis not present

## 2018-05-10 DIAGNOSIS — Z85828 Personal history of other malignant neoplasm of skin: Secondary | ICD-10-CM | POA: Diagnosis not present

## 2018-05-10 DIAGNOSIS — L578 Other skin changes due to chronic exposure to nonionizing radiation: Secondary | ICD-10-CM | POA: Diagnosis not present

## 2018-05-10 DIAGNOSIS — C44519 Basal cell carcinoma of skin of other part of trunk: Secondary | ICD-10-CM | POA: Diagnosis not present

## 2018-05-10 DIAGNOSIS — L570 Actinic keratosis: Secondary | ICD-10-CM | POA: Diagnosis not present

## 2018-05-10 DIAGNOSIS — Z1283 Encounter for screening for malignant neoplasm of skin: Secondary | ICD-10-CM | POA: Diagnosis not present

## 2018-07-05 DIAGNOSIS — F411 Generalized anxiety disorder: Secondary | ICD-10-CM | POA: Diagnosis not present

## 2018-07-05 DIAGNOSIS — F900 Attention-deficit hyperactivity disorder, predominantly inattentive type: Secondary | ICD-10-CM | POA: Diagnosis not present

## 2018-09-28 DIAGNOSIS — I10 Essential (primary) hypertension: Secondary | ICD-10-CM | POA: Diagnosis not present

## 2018-09-28 DIAGNOSIS — L309 Dermatitis, unspecified: Secondary | ICD-10-CM | POA: Diagnosis not present

## 2018-09-28 DIAGNOSIS — F419 Anxiety disorder, unspecified: Secondary | ICD-10-CM | POA: Diagnosis not present

## 2018-10-18 DIAGNOSIS — D225 Melanocytic nevi of trunk: Secondary | ICD-10-CM | POA: Diagnosis not present

## 2018-10-18 DIAGNOSIS — L570 Actinic keratosis: Secondary | ICD-10-CM | POA: Diagnosis not present

## 2018-10-18 DIAGNOSIS — L578 Other skin changes due to chronic exposure to nonionizing radiation: Secondary | ICD-10-CM | POA: Diagnosis not present

## 2018-10-18 DIAGNOSIS — Z85828 Personal history of other malignant neoplasm of skin: Secondary | ICD-10-CM | POA: Diagnosis not present

## 2018-10-18 DIAGNOSIS — D485 Neoplasm of uncertain behavior of skin: Secondary | ICD-10-CM | POA: Diagnosis not present

## 2018-10-18 DIAGNOSIS — L821 Other seborrheic keratosis: Secondary | ICD-10-CM | POA: Diagnosis not present

## 2018-10-18 DIAGNOSIS — Z1283 Encounter for screening for malignant neoplasm of skin: Secondary | ICD-10-CM | POA: Diagnosis not present

## 2018-10-18 DIAGNOSIS — L820 Inflamed seborrheic keratosis: Secondary | ICD-10-CM | POA: Diagnosis not present

## 2018-11-01 DIAGNOSIS — Z20828 Contact with and (suspected) exposure to other viral communicable diseases: Secondary | ICD-10-CM | POA: Diagnosis not present

## 2018-11-02 DIAGNOSIS — Z20828 Contact with and (suspected) exposure to other viral communicable diseases: Secondary | ICD-10-CM | POA: Diagnosis not present

## 2018-11-21 IMAGING — DX DG SHOULDER 2+V*L*
3 series · 3 of 3 positions shown · non-contrast
Comparison: None in PACs

CLINICAL DATA: For 5 week history of anterior left shoulder pain.
No report of injury.

EXAM:
LEFT SHOULDER - 2+ VIEW

[dg shoulder left (1 of 3)]
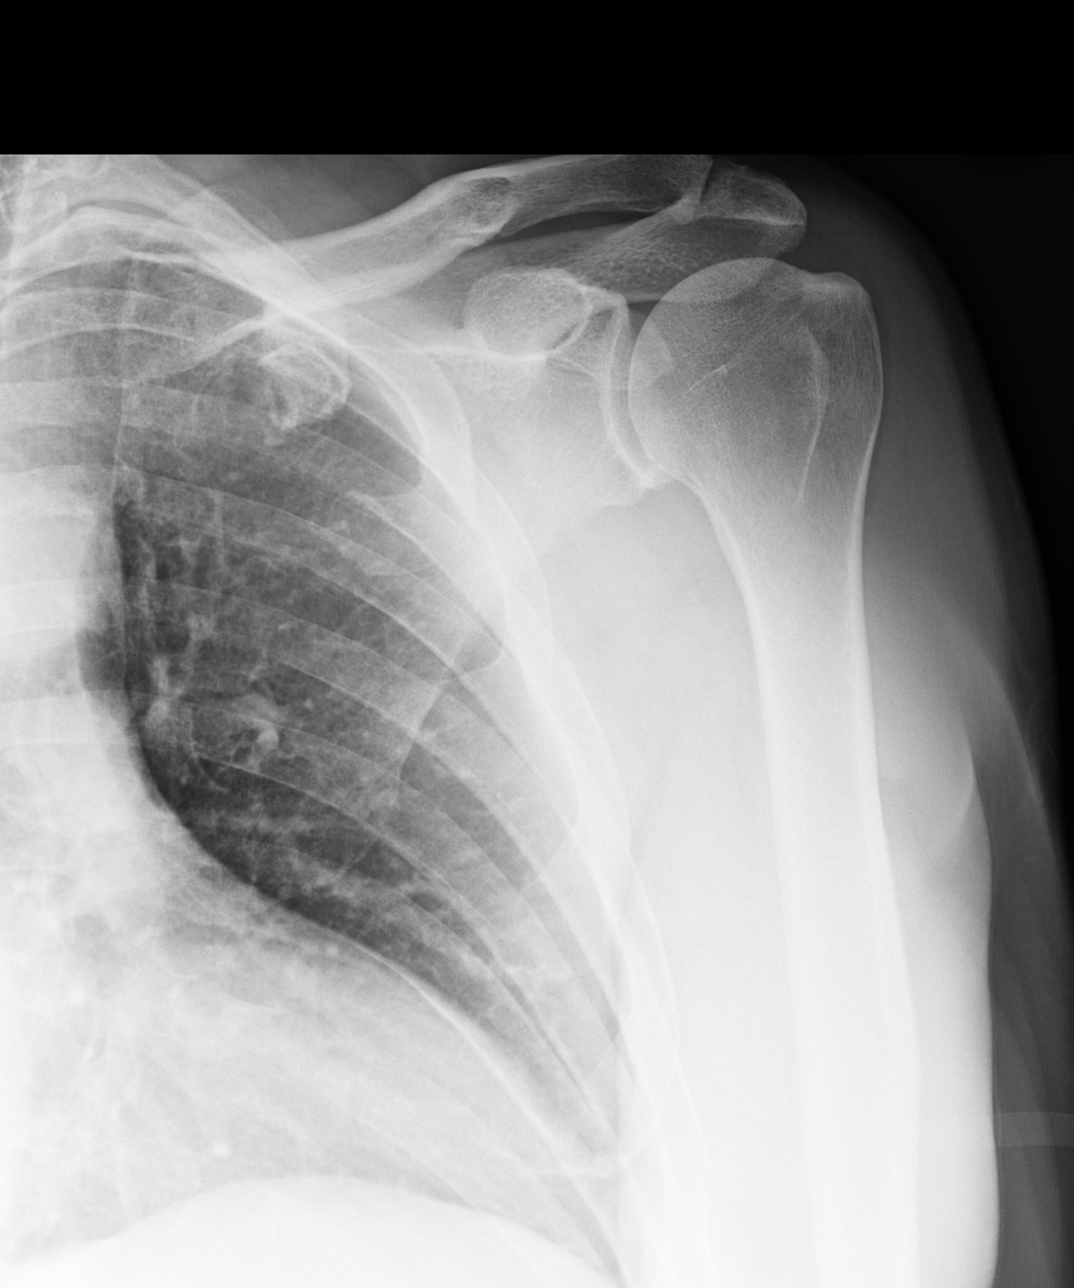

[dg shoulder left (2 of 3)]
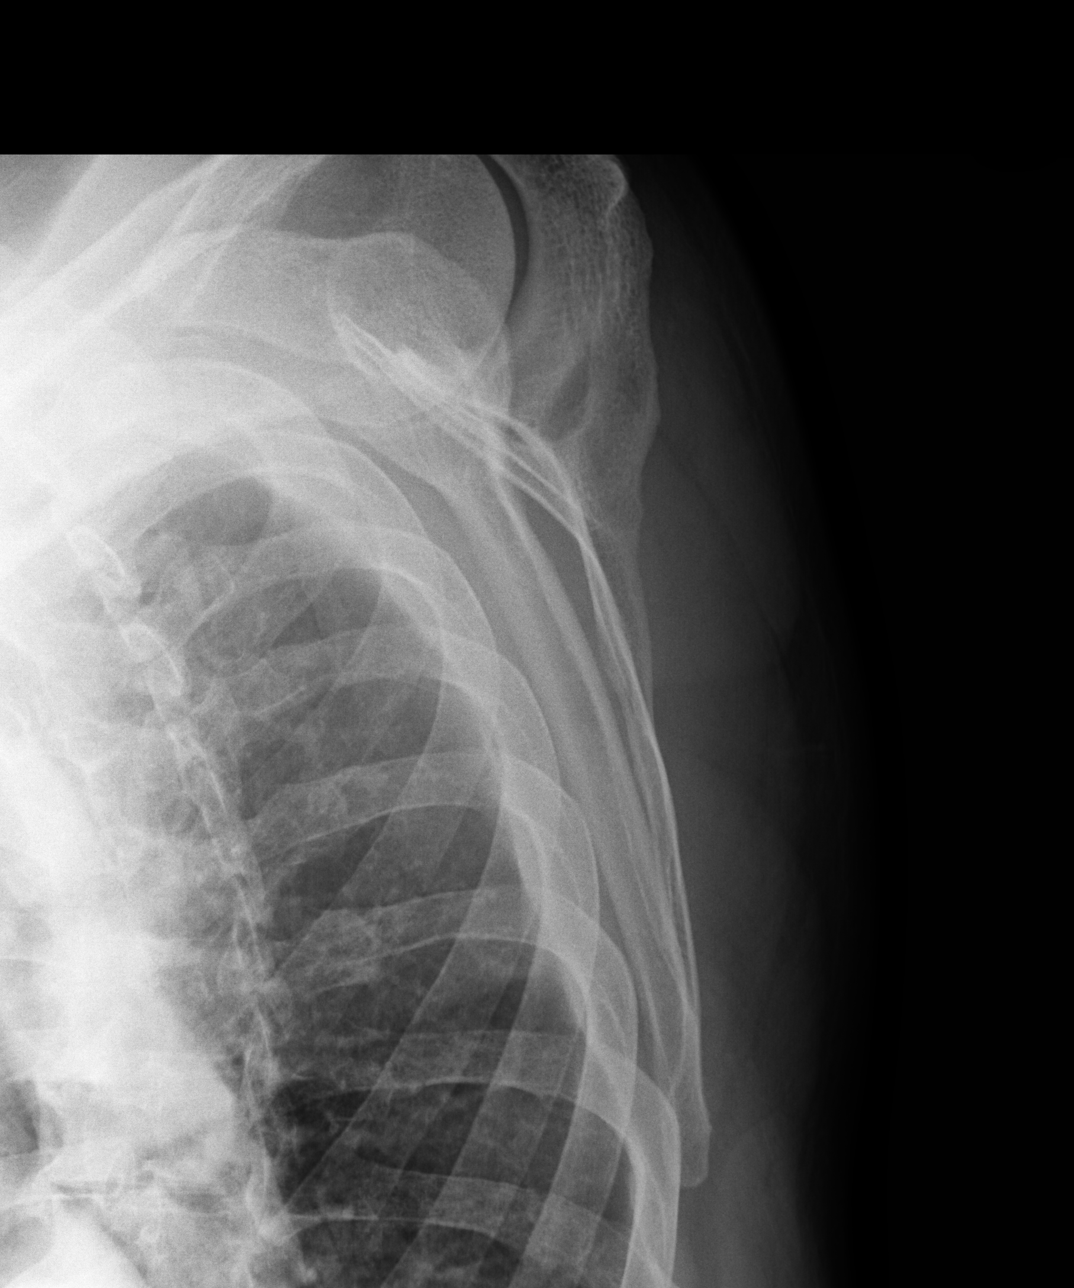

[dg shoulder left (3 of 3)]
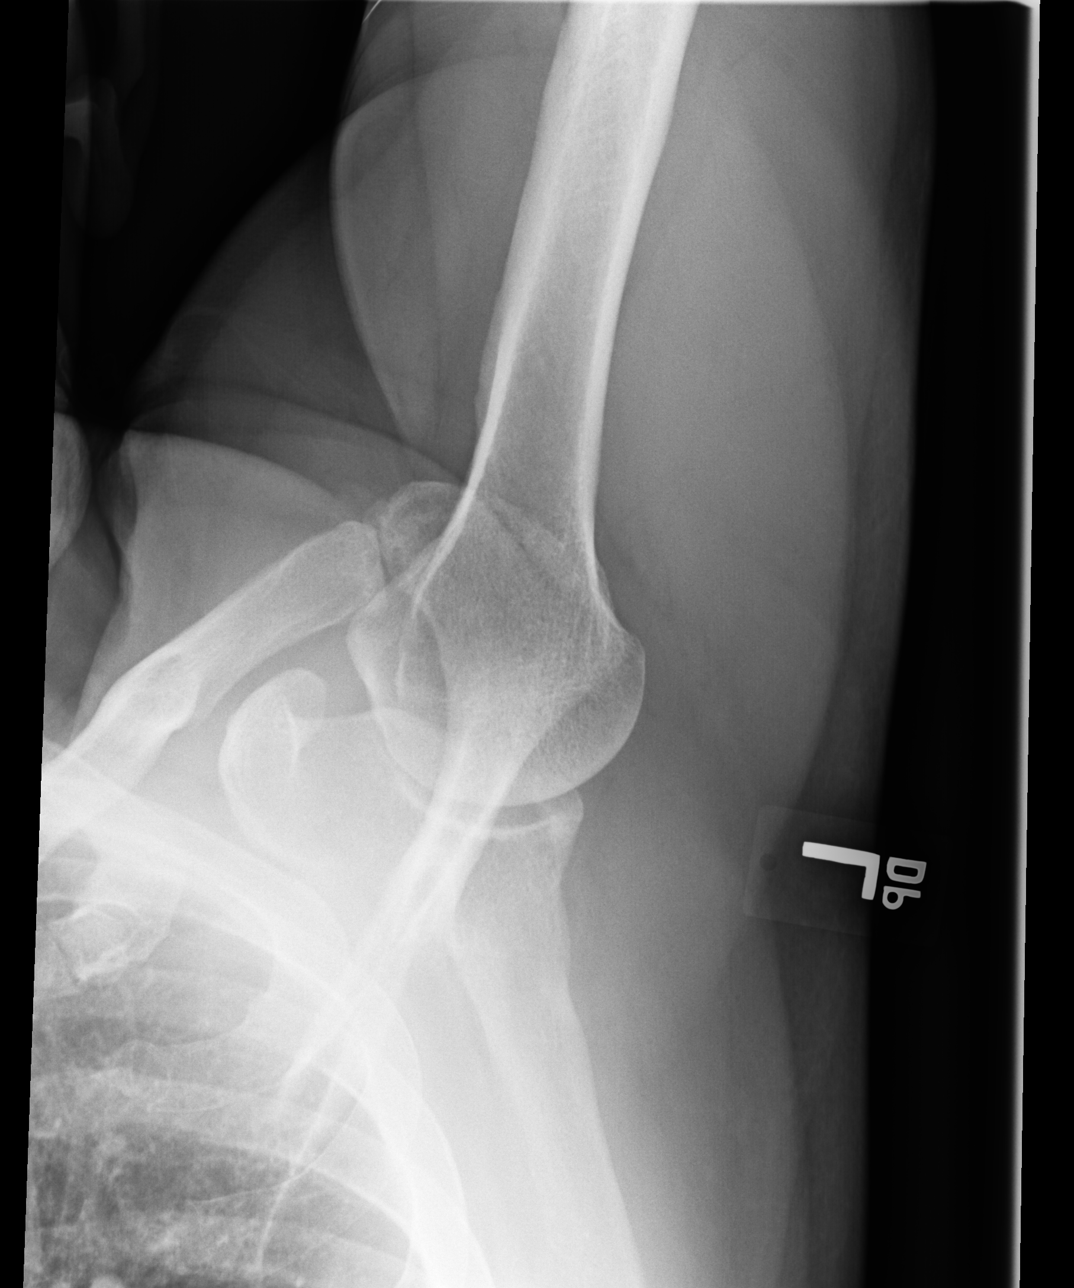

[3 of 3 positions shown; findings below may reference images not displayed]

FINDINGS: The bones are subjectively adequately mineralized. There is no acute
fracture or dislocation. There is narrowing of the AC joint with a
tiny spur arising from the inferior articular margin of the distal
clavicle. The soft tissues exhibit no abnormal calcifications.
IMPRESSION: No acute bony abnormality of the left shoulder is observed. There
are degenerative changes of the AC joint.

## 2018-12-17 DIAGNOSIS — F3342 Major depressive disorder, recurrent, in full remission: Secondary | ICD-10-CM | POA: Diagnosis not present

## 2018-12-17 DIAGNOSIS — F900 Attention-deficit hyperactivity disorder, predominantly inattentive type: Secondary | ICD-10-CM | POA: Diagnosis not present

## 2019-02-03 ENCOUNTER — Encounter: Payer: Self-pay | Admitting: Family Medicine

## 2019-02-03 ENCOUNTER — Ambulatory Visit (INDEPENDENT_AMBULATORY_CARE_PROVIDER_SITE_OTHER)
Admission: RE | Admit: 2019-02-03 | Discharge: 2019-02-03 | Disposition: A | Discharge status: Home / Self Care | Payer: BC Managed Care – PPO | Source: Ambulatory Visit | Attending: Family Medicine | Admitting: Family Medicine

## 2019-02-03 ENCOUNTER — Other Ambulatory Visit: Payer: Self-pay

## 2019-02-03 ENCOUNTER — Ambulatory Visit: Payer: BLUE CROSS/BLUE SHIELD | Admitting: Family Medicine

## 2019-02-03 VITALS — BP 106/62 | HR 85 | Ht 71.0 in | Wt 223.0 lb

## 2019-02-03 DIAGNOSIS — M999 Biomechanical lesion, unspecified: Secondary | ICD-10-CM | POA: Diagnosis not present

## 2019-02-03 DIAGNOSIS — M546 Pain in thoracic spine: Secondary | ICD-10-CM

## 2019-02-03 DIAGNOSIS — M47814 Spondylosis without myelopathy or radiculopathy, thoracic region: Secondary | ICD-10-CM | POA: Diagnosis not present

## 2019-02-03 DIAGNOSIS — M24559 Contracture, unspecified hip: Secondary | ICD-10-CM | POA: Insufficient documentation

## 2019-02-03 DIAGNOSIS — M24551 Contracture, right hip: Secondary | ICD-10-CM

## 2019-02-03 MED ORDER — MELOXICAM 15 MG PO TABS: 15.0000 mg | ORAL_TABLET | Freq: Every day | ORAL | 0 refills | Status: DC

## 2019-02-03 MED ORDER — TIZANIDINE HCL 4 MG PO TABS: 4.0000 mg | ORAL_TABLET | Freq: Every day | ORAL | 0 refills | Status: DC

## 2019-02-03 NOTE — Progress Notes (Signed)
[facility] [address], [place] [address] Phone: [phone] Subjective:   [name], am serving as a scribe for Dr. Hulan Saas.   CC: Low back pain  [id number]  Roger [name] is a 58 y.o. male coming in with complaint of thoracic spine pain. Pain below scapula over the spine. Turned last week at work and felt a pop in his back. Pain more with extension. Does usually do yoga but has not been going due to [place]. Has tried 3 different beds.   Had pain in his back 3 years ago and saw [facility].  Patient was told that nothing was wrong at that time.  Patient stopped following up with him.  Now if he stays in a position for too long starts having increasing discomfort and pain.  Rates the severity of pain is 9 out of 10.  Symptoms can wake him up at night.     Past Medical History:  Diagnosis Date  . ADD (attention deficit disorder)   . Allergy   . Anxiety   . Arthritis   . Bipolar disorder ([place])   . Depression    DENIES, HAS ADD  . GERD (gastroesophageal reflux disease)    GASTRITIS,  . Hypertension   . 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 Kidney stones   . Pain    CHRONIC BACK  . Sleep apnea    CPAP  . Stroke [facility])    hemorrhagic   Past Surgical History:  Procedure Laterality Date  . COLONOSCOPY    . HERNIA REPAIR     Umbilical Hernia  . INGUINAL HERNIA REPAIR Right 09/25/2015   Procedure: HERNIA REPAIR INGUINAL ADULT;  Surgeon: Leonie Green, MD;  Location: [facility] ORS;  Service: General;  Laterality: Right;   Social History   Socioeconomic History  . Marital status: Single    Spouse name: Not on file  . Number of children: 0  . Years of education: 40  . Highest education level: Not on file  Occupational History  . Occupation: Theme park manager  Social Needs  . Financial resource strain: Not on file  . Food insecurity    Worry: Not on file    Inability: Not on file  . Transportation  needs    Medical: Not on file    Non-medical: Not on file  Tobacco Use  . Smoking status: Former Smoker    Packs/day: 0.50    Years: 10.00    Pack years: 5.00    Types: Cigarettes    Quit date: 09/16/2013    Years since quitting: 5.3  . Smokeless tobacco: Never Used  Substance and Sexual Activity  . Alcohol use: Yes    Comment: One drink a night  . Drug use: Yes    Types: Marijuana  . Sexual activity: Yes    Birth control/protection: None  Lifestyle  . Physical activity    Days per week: Not on file    Minutes per session: Not on file  . Stress: Not on file  Relationships  . Social Herbalist on phone: Not on file    Gets together: Not on file    Attends religious service: Not on file    Active member of club or organization: Not on file    Attends meetings of clubs or organizations: Not on file    Relationship status: Not on file  Other Topics Concern  . Not  on file  Social History Narrative   ** Merged History Encounter **       ** Data from: 11/03/13 Enc Dept: [facility]       ** Data from: 05/15/14 Enc Dept: [facility]   Born in [place], [place] and raised in [place], [place]. Currently resides in a private residence with his partner and roommate. Fun: Work Denies any religious beliefs effecting healthcare.      No Known Allergies Family History  Problem Relation Age of Onset  . Pancreatic cancer Mother   . Hypertension Mother   . Diabetes Mother   . Obesity Mother      Current Outpatient Medications (Cardiovascular):  .  lisinopril-hydrochlorothiazide (PRINZIDE,ZESTORETIC) 10-12.5 MG tablet, Take 1 tablet by mouth daily.  Current Outpatient Medications (Respiratory):  .  cetirizine (ZYRTEC) 10 MG tablet, Take 10 mg by mouth daily.  Current Outpatient Medications (Analgesics):  .  HYDROcodone-acetaminophen (NORCO) 5-325 MG tablet, Take 1-2 tablets by mouth every 4 (four) hours as needed for moderate pain. .  meloxicam (MOBIC) 15 MG  tablet, Take 1 tablet (15 mg total) by mouth daily.   Current Outpatient Medications (Other):  Marland Kitchen  ALPRAZolam (XANAX) 1 MG tablet, Take 1 mg by mouth 2 (two) times daily as needed for anxiety.  Marland Kitchen  amphetamine-dextroamphetamine (ADDERALL) 20 MG tablet, Take 20 mg by mouth daily. .  pantoprazole (PROTONIX) 40 MG tablet, Take 40 mg by mouth daily. .  traZODone (DESYREL) 100 MG tablet, Take 100 mg by mouth at bedtime. Marland Kitchen  tiZANidine (ZANAFLEX) 4 MG tablet, Take 1 tablet (4 mg total) by mouth at bedtime.    Past medical history, social, surgical and family history all reviewed in electronic medical record.  No pertanent information unless stated regarding to the chief complaint.   Review of Systems:  No headache, visual changes, nausea, vomiting, diarrhea, constipation, dizziness, abdominal pain, skin rash, fevers, chills, night sweats, weight loss, swollen lymph nodes, body aches, joint swelling, , chest pain, shortness of breath, mood changes.  Positive muscle aches  Objective  Blood pressure 106/62, pulse 85, height 5\' 11"  (1.803 m), weight 223 lb (101.2 kg), SpO2 96 %.    General: No apparent distress alert and oriented x3 mood and affect normal, dressed appropriately.  HEENT: Pupils equal, extraocular movements intact  Respiratory: Patient's speak in full sentences and does not appear short of breath  Cardiovascular: No lower extremity edema, non tender, no erythema  Skin: Warm dry intact with no signs of infection or rash on extremities or on axial skeleton.  Abdomen: Soft nontender  Neuro: Cranial nerves II through XII are intact, neurovascularly intact in all extremities with 2+ DTRs and 2+ pulses.  Lymph: No lymphadenopathy of posterior or anterior cervical chain or axillae bilaterally.  Gait normal with good balance and coordination.  MSK:  Non tender with full range of motion and good stability and symmetric strength and tone of shoulders, elbows, wrist, hip, knee and ankles  bilaterally.  Back Exam:  Inspection: Mild loss of lordosis Motion: Flexion 45 deg, Extension 25 deg, Side Bending to 35 deg bilaterally,  Rotation to 35 deg bilaterally  SLR laying: Negative  XSLR laying: Negative  Palpable tenderness: Tender to palpation the paraspinal musculature lumbar spine right greater than left. FABER: Tightness bilaterally. Sensory change: Gross sensation intact to all lumbar and sacral dermatomes.  Reflexes: 2+ at both patellar tendons, 2+ at achilles tendons, Babinski's downgoing.  Strength at foot  Plantar-flexion: 5/5 Dorsi-flexion: 5/5 Eversion: 5/5  Inversion: 5/5  Leg strength  Quad: 5/5 Hamstring: 5/5 Hip flexor: 5/5 Hip abductors: 5/5  Gait unremarkable.   Osteopathic findings T9 extended rotated and side bent left L1 flexed rotated and side bent right Sacrum right on right  [id number]; 15 additional minutes spent for Therapeutic exercises as stated in above notes.  This included exercises focusing on stretching, strengthening, with significant focus on eccentric aspects.   Long term goals include an improvement in range of motion, strength, endurance as well as avoiding reinjury. Patient's frequency would include in 1-2 times a day, 3-5 times a week for a duration of 6-12 weeks. Low back exercises that included:  Pelvic tilt/bracing instruction to focus on control of the pelvic girdle and lower abdominal muscles  Glute strengthening exercises, focusing on proper firing of the glutes without engaging the low back muscles Proper stretching techniques for maximum relief for the hamstrings, hip flexors, low back and some rotation where tolerated   Proper technique shown and discussed handout in great detail with ATC.  All questions were discussed and answered.     Impression and Recommendations:     This case required medical decision making of moderate complexity. The above documentation has been reviewed and is accurate and complete Lyndal Pulley, DO        Note: This dictation was prepared with Dragon dictation along with smaller phrase technology. Any transcriptional errors that result from this process are unintentional.

## 2019-02-03 NOTE — Assessment & Plan Note (Signed)
Decision today to treat with OMT was based on Physical Exam  After verbal consent patient was treated with HVLA, ME, FPR techniques in  thoracic, lumbar and sacral areas  Patient tolerated the procedure well with improvement in symptoms  Patient given exercises, stretches and lifestyle modifications  See medications in patient instructions if given  Patient will follow up in 4-8 weeks 

## 2019-02-03 NOTE — Patient Instructions (Addendum)
Good to see you.  Ice 20 minutes 2 times daily. Usually after activity and before bed. Exercises 3 times a week.  Turmeric 500mg  daily  Vitamin D 2000 IU daily  meloxicam 10 days than as needed See me again in 4-5 weeks

## 2019-02-03 NOTE — Assessment & Plan Note (Signed)
Hip flexor tightness noted.  Right greater than left.  Discussed icing regimen and home exercise, which activities to do which was to avoid.  Discussed posture and ergonomics.  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  Meloxicam for short course will potential side effects.  Patient also will try the muscle exercise.  Responded well to the manipulation.  With me again in 4 to 6 weeks

## 2019-03-07 ENCOUNTER — Ambulatory Visit (INDEPENDENT_AMBULATORY_CARE_PROVIDER_SITE_OTHER): Payer: BC Managed Care – PPO | Admitting: Family Medicine

## 2019-03-07 ENCOUNTER — Other Ambulatory Visit: Payer: Self-pay

## 2019-03-07 ENCOUNTER — Encounter: Payer: Self-pay | Admitting: Family Medicine

## 2019-03-07 VITALS — BP 128/90 | HR 83 | Ht 71.0 in | Wt 220.0 lb

## 2019-03-07 DIAGNOSIS — M999 Biomechanical lesion, unspecified: Secondary | ICD-10-CM | POA: Diagnosis not present

## 2019-03-07 DIAGNOSIS — M24551 Contracture, right hip: Secondary | ICD-10-CM

## 2019-03-07 NOTE — Assessment & Plan Note (Signed)
Decision today to treat with OMT was based on Physical Exam  After verbal consent patient was treated with HVLA, ME, FPR techniques in  thoracic, lumbar and sacral areas  Patient tolerated the procedure well with improvement in symptoms  Patient given exercises, stretches and lifestyle modifications  See medications in patient instructions if given  Patient will follow up in  6-8 weeks 

## 2019-03-07 NOTE — Progress Notes (Signed)
[name] [address] [place], [place] [address] Phone: [phone] Subjective:   [name], am serving as a scribe for Dr. Hulan Saas.   CC: Back and neck pain follow-up  [id number]   02/03/2019 Hip flexor tightness noted.  Right greater than left.  Discussed icing regimen and home exercise, which activities to do which was to avoid.  Discussed posture and ergonomics.  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  Meloxicam for short course will potential side effects.  Patient also will try the muscle exercise.  Responded well to the manipulation.  With me again in 4 to 6 weeks   Update 03/07/2019 [name] is a 58 y.o. male coming in with complaint of back pain. Patient is here for OMT today. Patient states continues to have some discomfort and pain.  Patient states After the manipulation still feels better than what it was previously.  Patient has not been needing some of his pain medications as regularly either.     Past Medical History:  Diagnosis Date  . ADD (attention deficit disorder)   . Allergy   . Anxiety   . Arthritis   . Bipolar disorder ([facility])   . Depression    DENIES, HAS ADD  . GERD (gastroesophageal reflux disease)    GASTRITIS,  . Hypertension   . 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 Kidney stones   . Pain    CHRONIC BACK  . Sleep apnea    CPAP  . Stroke [facility])    hemorrhagic   Past Surgical History:  Procedure Laterality Date  . COLONOSCOPY    . HERNIA REPAIR     Umbilical Hernia  . INGUINAL HERNIA REPAIR Right 09/25/2015   Procedure: HERNIA REPAIR INGUINAL ADULT;  Surgeon: Leonie Green, MD;  Location: [facility] ORS;  Service: General;  Laterality: Right;   Social History   Socioeconomic History  . Marital status: Single    Spouse name: Not on file  . Number of children: 0  . Years of education: 34  . Highest education level: Not on  file  Occupational History  . Occupation: Theme park manager  Social Needs  . Financial resource strain: Not on file  . Food insecurity    Worry: Not on file    Inability: Not on file  . Transportation needs    Medical: Not on file    Non-medical: Not on file  Tobacco Use  . Smoking status: Former Smoker    Packs/day: 0.50    Years: 10.00    Pack years: 5.00    Types: Cigarettes    Quit date: 09/16/2013    Years since quitting: 5.4  . Smokeless tobacco: Never Used  Substance and Sexual Activity  . Alcohol use: Yes    Comment: One drink a night  . Drug use: Yes    Types: Marijuana  . Sexual activity: Yes    Birth control/protection: None  Lifestyle  . Physical activity    Days per week: Not on file    Minutes per session: Not on file  . Stress: Not on file  Relationships  . Social Herbalist on phone: Not on file    Gets together: Not on file    Attends religious service: Not on file    Active member of club or organization: Not on file    Attends meetings of clubs or  organizations: Not on file    Relationship status: Not on file  Other Topics Concern  . Not on file  Social History Narrative   ** Merged History Encounter **       ** Data from: 11/03/13 Enc Dept: [facility]       ** Data from: 05/15/14 Enc Dept: [facility]   Born in [place], [place] and raised in [place]. Currently resides in a private residence with his partner and roommate. Fun: Work Denies any religious beliefs effecting healthcare.      No Known Allergies Family History  Problem Relation Age of Onset  . Pancreatic cancer Mother   . Hypertension Mother   . Diabetes Mother   . Obesity Mother      Current Outpatient Medications (Cardiovascular):  .  lisinopril-hydrochlorothiazide (PRINZIDE,ZESTORETIC) 10-12.5 MG tablet, Take 1 tablet by mouth daily.  Current Outpatient Medications (Respiratory):  .  cetirizine (ZYRTEC) 10 MG tablet, Take 10 mg by mouth daily.   Current Outpatient Medications (Analgesics):  .  HYDROcodone-acetaminophen (NORCO) 5-325 MG tablet, Take 1-2 tablets by mouth every 4 (four) hours as needed for moderate pain. .  meloxicam (MOBIC) 15 MG tablet, Take 1 tablet (15 mg total) by mouth daily.   Current Outpatient Medications (Other):  Marland Kitchen  ALPRAZolam (XANAX) 1 MG tablet, Take 1 mg by mouth 2 (two) times daily as needed for anxiety.  Marland Kitchen  amphetamine-dextroamphetamine (ADDERALL) 20 MG tablet, Take 20 mg by mouth daily. .  pantoprazole (PROTONIX) 40 MG tablet, Take 40 mg by mouth daily. Marland Kitchen  tiZANidine (ZANAFLEX) 4 MG tablet, Take 1 tablet (4 mg total) by mouth at bedtime. .  traZODone (DESYREL) 100 MG tablet, Take 100 mg by mouth at bedtime.    Past medical history, social, surgical and family history all reviewed in electronic medical record.  No pertanent information unless stated regarding to the chief complaint.   Review of Systems:  No headache, visual changes, nausea, vomiting, diarrhea, constipation, dizziness, abdominal pain, skin rash, fevers, chills, night sweats, weight loss, swollen lymph nodes, body aches, joint swelling,chest pain, shortness of breath, mood changes.  Positive muscle aches  Objective  Blood pressure 128/90, pulse 83, height 5\' 11"  (1.803 m), weight 220 lb (99.8 kg), SpO2 98 %.    General: No apparent distress alert and oriented x3 mood and affect normal, dressed appropriately.  HEENT: Pupils equal, extraocular movements intact  Respiratory: Patient's speak in full sentences and does not appear short of breath  Cardiovascular: No lower extremity edema, non tender, no erythema  Skin: Warm dry intact with no signs of infection or rash on extremities or on axial skeleton.  Abdomen: Soft nontender  Neuro: Cranial nerves II through XII are intact, neurovascularly intact in all extremities with 2+ DTRs and 2+ pulses.  Lymph: No lymphadenopathy of posterior or anterior cervical chain or axillae bilaterally.   Gait normal with good balance and coordination.  MSK:  Non tender with full range of motion and good stability and symmetric strength and tone of shoulders, elbows, wrist, hip, knee and ankles bilaterally.  Patient's back exam shows some tightness in the parascapular region.  Tender to palpation in this area.  No pain over the spinous process.  Patient does have very mild weakness of the scapula noted but no winging.  Osteopathic findings  T5 extended rotated and side bent left L2 flexed rotated and side bent right Sacrum right on right     Impression and Recommendations:  This case required medical decision making of moderate complexity. The above documentation has been reviewed and is accurate and complete Lyndal Pulley, DO       Note: This dictation was prepared with Dragon dictation along with smaller phrase technology. Any transcriptional errors that result from this process are unintentional.

## 2019-03-07 NOTE — Assessment & Plan Note (Signed)
Hip flexor tightness noted.  Discussed with patient with icing regimen and home exercises, discussed which activities to avoid.  Patient has responded very well to osteopathic manipulation.  No change in medications.  Follow-up with me again in 6-8 weeks

## 2019-03-09 ENCOUNTER — Other Ambulatory Visit: Payer: Self-pay | Admitting: Family Medicine

## 2019-03-09 MED ORDER — MELOXICAM 15 MG PO TABS: 15.0000 mg | ORAL_TABLET | Freq: Every day | ORAL | 0 refills | Status: DC

## 2019-03-09 NOTE — Telephone Encounter (Signed)
meloxicam (MOBIC) 15 MG tablet    Pharmacy calling for refill.    Pharmacy:  [facility], [place] [phone] (Phone) [phone] (Fax

## 2019-03-09 NOTE — Telephone Encounter (Signed)
Requested medication (s) are due for refill today: yes  Requested medication (s) are on the active medication list: yes  Last refill:  02/03/2019  Future visit scheduled:yes  Notes to clinic:  Review for refill   Requested Prescriptions  Pending Prescriptions Disp Refills   meloxicam (MOBIC) 15 MG tablet 30 tablet 0    Sig: Take 1 tablet (15 mg total) by mouth daily.     Analgesics:  COX2 Inhibitors Failed - 03/09/2019 12:11 PM      Failed - HGB in normal range and within 360 days    Hemoglobin  Date Value Ref Range Status  05/15/2014 16.5 13.0 - 17.0 g/dL Final   HGB  Date Value Ref Range Status  03/13/2014 15.4 13.0 - 18.0 g/dL Final         Failed - Cr in normal range and within 360 days    Creatinine  Date Value Ref Range Status  11/12/2012 1.14 0.60 - 1.30 mg/dL Final   Creatinine, Ser  Date Value Ref Range Status  05/15/2014 1.1 0.4 - 1.5 mg/dL Final         Passed - Patient is not pregnant      Passed - Valid encounter within last 12 months    Recent Outpatient Visits          2 days ago Nonallopathic lesion of sacral region   [place], [place], DO   1 month ago Thoracic spine pain   [place], [place], DO   4 years ago Routine general medical examination at a health care facility   [place], [place]   4 years ago Essential hypertension   [place], FNP      Future Appointments            In 1 month Tamala Julian Olevia [place], [place], [facility]

## 2019-04-18 ENCOUNTER — Other Ambulatory Visit: Payer: Self-pay

## 2019-04-18 ENCOUNTER — Ambulatory Visit (INDEPENDENT_AMBULATORY_CARE_PROVIDER_SITE_OTHER): Payer: BC Managed Care – PPO | Admitting: Family Medicine

## 2019-04-18 ENCOUNTER — Encounter: Payer: Self-pay | Admitting: Family Medicine

## 2019-04-18 VITALS — BP 108/80 | HR 50 | Ht 71.0 in | Wt 218.0 lb

## 2019-04-18 DIAGNOSIS — D225 Melanocytic nevi of trunk: Secondary | ICD-10-CM | POA: Diagnosis not present

## 2019-04-18 DIAGNOSIS — M24551 Contracture, right hip: Secondary | ICD-10-CM | POA: Diagnosis not present

## 2019-04-18 DIAGNOSIS — L820 Inflamed seborrheic keratosis: Secondary | ICD-10-CM | POA: Diagnosis not present

## 2019-04-18 DIAGNOSIS — Z86018 Personal history of other benign neoplasm: Secondary | ICD-10-CM | POA: Diagnosis not present

## 2019-04-18 DIAGNOSIS — Z85828 Personal history of other malignant neoplasm of skin: Secondary | ICD-10-CM | POA: Diagnosis not present

## 2019-04-18 DIAGNOSIS — M999 Biomechanical lesion, unspecified: Secondary | ICD-10-CM

## 2019-04-18 DIAGNOSIS — L578 Other skin changes due to chronic exposure to nonionizing radiation: Secondary | ICD-10-CM | POA: Diagnosis not present

## 2019-04-18 DIAGNOSIS — L570 Actinic keratosis: Secondary | ICD-10-CM | POA: Diagnosis not present

## 2019-04-18 NOTE — Assessment & Plan Note (Signed)
Decision today to treat with OMT was based on Physical Exam  After verbal consent patient was treated with HVLA, ME, FPR techniques in  thoracic, lumbar and sacral areas  Patient tolerated the procedure well with improvement in symptoms  Patient given exercises, stretches and lifestyle modifications  See medications in patient instructions if given  Patient will follow up in 4-8 weeks 

## 2019-04-18 NOTE — Progress Notes (Signed)
[name] [address] [place], [place] [address] Phone: [phone] Subjective:   [name], am serving as a scribe for Dr. Hulan Saas. This visit occurred during the SARS-CoV-2 public health emergency.  Safety protocols were in place, including screening questions prior to the visit, additional usage of staff PPE, and extensive cleaning of exam room while observing appropriate contact time as indicated for disinfecting solutions.     CC: Neck pain follow-up  [id number]  [name] is a 58 y.o. male coming in with complaint of back and neck pain. Last seen on 03/07/2019 for OMT. Patient states that he has been having increase in pain in the right side.  Discussed with patient He has been doing the exercises occasionally.  Sometimes finds it difficult to do that fairly regularly.  Continues to take meloxicam most days of the week    Past Medical History:  Diagnosis Date  . ADD (attention deficit disorder)   . Allergy   . Anxiety   . Arthritis   . Bipolar disorder ([place])   . Depression    DENIES, HAS ADD  . GERD (gastroesophageal reflux disease)    GASTRITIS,  . Hypertension   . 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 Kidney stones   . Pain    CHRONIC BACK  . Sleep apnea    CPAP  . Stroke [facility])    hemorrhagic   Past Surgical History:  Procedure Laterality Date  . COLONOSCOPY    . HERNIA REPAIR     Umbilical Hernia  . INGUINAL HERNIA REPAIR Right 09/25/2015   Procedure: HERNIA REPAIR INGUINAL ADULT;  Surgeon: Leonie Green, MD;  Location: [facility] ORS;  Service: General;  Laterality: Right;   Social History   Socioeconomic History  . Marital status: Single    Spouse name: Not on file  . Number of children: 0  . Years of education: 68  . Highest education level: Not on file  Occupational History  . Occupation: Hairdresser  Tobacco Use  . Smoking status: Former Smoker    Packs/day: 0.50    Years:  10.00    Pack years: 5.00    Types: Cigarettes    Quit date: 09/16/2013    Years since quitting: 5.5  . Smokeless tobacco: Never Used  Substance and Sexual Activity  . Alcohol use: Yes    Comment: One drink a night  . Drug use: Yes    Types: Marijuana  . Sexual activity: Yes    Birth control/protection: None  Other Topics Concern  . Not on file  Social History Narrative   ** Merged History Encounter **       ** Data from: 11/03/13 Enc Dept: [facility]       ** Data from: 05/15/14 Enc Dept: [facility]   Born in [place], [place] and raised in [place], [place]. Currently resides in a private residence with his partner and roommate. Fun: Work Denies any religious beliefs effecting healthcare.      Social Determinants of Health   Financial Resource Strain:   . Difficulty of Paying Living Expenses: Not on file  Food Insecurity:   . Worried About Charity fundraiser in the Last Year: Not on file  . Ran Out of Food in the Last Year: Not on file  Transportation Needs:   . Lack of Transportation (Medical): Not on file  . Lack of Transportation (Non-Medical): Not on file  Physical Activity:   . Days of Exercise per Week: Not on file  . Minutes of Exercise per Session: Not on file  Stress:   . Feeling of Stress : Not on file  Social Connections:   . Frequency of Communication with Friends and Family: Not on file  . Frequency of Social Gatherings with Friends and Family: Not on file  . Attends Religious Services: Not on file  . Active Member of Clubs or Organizations: Not on file  . Attends Archivist Meetings: Not on file  . Marital Status: Not on file   [name] Known Allergies Family History  Problem Relation Age of Onset  . Pancreatic cancer Mother   . Hypertension Mother   . Diabetes Mother   . Obesity Mother      Current Outpatient Medications (Cardiovascular):  .  lisinopril-hydrochlorothiazide (PRINZIDE,ZESTORETIC) 10-12.5 MG tablet, Take 1 tablet by  mouth daily.  Current Outpatient Medications (Respiratory):  .  cetirizine (ZYRTEC) 10 MG tablet, Take 10 mg by mouth daily.  Current Outpatient Medications (Analgesics):  .  HYDROcodone-acetaminophen (NORCO) 5-325 MG tablet, Take 1-2 tablets by mouth every 4 (four) hours as needed for moderate pain. .  meloxicam (MOBIC) 15 MG tablet, Take 1 tablet (15 mg total) by mouth daily.   Current Outpatient Medications (Other):  Marland Kitchen  ALPRAZolam (XANAX) 1 MG tablet, Take 1 mg by mouth 2 (two) times daily as needed for anxiety.  Marland Kitchen  amphetamine-dextroamphetamine (ADDERALL) 20 MG tablet, Take 20 mg by mouth daily. .  pantoprazole (PROTONIX) 40 MG tablet, Take 40 mg by mouth daily. Marland Kitchen  tiZANidine (ZANAFLEX) 4 MG tablet, Take 1 tablet (4 mg total) by mouth at bedtime. .  traZODone (DESYREL) 100 MG tablet, Take 100 mg by mouth at bedtime.    Past medical history, social, surgical and family history all reviewed in electronic medical record.  [name] pertanent information unless stated regarding to the chief complaint.   Review of Systems:  [name] headache, visual changes, nausea, vomiting, diarrhea, constipation, dizziness, abdominal pain, skin rash, fevers, chills, night sweats, weight loss, swollen lymph nodes, body aches, joint swelling, muscle aches, chest pain, shortness of breath, mood changes.   Objective  Blood pressure 108/80, pulse (!) 50, height 5\' 11"  (1.803 m), weight 218 lb (98.9 kg), SpO2 97 %.    General: [name] apparent distress alert and oriented x3 mood and affect normal, dressed appropriately.  HEENT: Pupils equal, extraocular movements intact  Respiratory: Patient's speak in full sentences and does not appear short of breath  Cardiovascular: [name] lower extremity edema, non tender, [name] erythema  Skin: Warm dry intact with [name] signs of infection or rash on extremities or on axial skeleton.  Abdomen: Soft nontender  Neuro: Cranial nerves II through XII are intact, neurovascularly intact in all  extremities with 2+ DTRs and 2+ pulses.  Lymph: [name] lymphadenopathy of posterior or anterior cervical chain or axillae bilaterally.  Gait normal with good balance and coordination.  MSK:  Non tender with full range of motion and good stability and symmetric strength and tone of shoulders, elbows, wrist, hip, knee and ankles bilaterally.  Neck: Inspection mild loss of lordosis. [name] palpable stepoffs. Negative Spurling's maneuver. Full neck range of motion Grip strength and sensation normal in bilateral hands Strength good C4 to T1 distribution [name] sensory change to C4 to T1 Negative Hoffman sign bilaterally Reflexes normal   Back exam does have some mild loss of lordosis.  Some tenderness in the thoracolumbar as well as  lumbosacral juncture's bilaterally right greater than left.  Negative straight leg test.  Mild tightness still with extension of the back but is more than previous exam.  Osteopathic findings  T6 extended rotated and side bent left L2 flexed rotated and side bent right Sacrum right on right     Impression and Recommendations:     This case required medical decision making of moderate complexity. The above documentation has been reviewed and is accurate and complete Lyndal Pulley, DO       Note: This dictation was prepared with Dragon dictation along with smaller phrase technology. Any transcriptional errors that result from this process are unintentional.

## 2019-04-18 NOTE — Assessment & Plan Note (Signed)
Continues tightness noted.  We discussed changing certain working positions.  We discussed ergonomics that can help during the day.  Continue the vitamin supplementations.  Patient will follow up with me again in 4 to 8 weeks

## 2019-04-18 NOTE — Patient Instructions (Addendum)
  [address] [place], [place] [address] Phone [phone]  See me again in 6 weeks

## 2019-05-30 ENCOUNTER — Ambulatory Visit (INDEPENDENT_AMBULATORY_CARE_PROVIDER_SITE_OTHER): Payer: 59 | Admitting: Family Medicine

## 2019-05-30 ENCOUNTER — Other Ambulatory Visit: Payer: Self-pay

## 2019-05-30 ENCOUNTER — Encounter: Payer: Self-pay | Admitting: Family Medicine

## 2019-05-30 VITALS — BP 110/76 | HR 90 | Ht 71.0 in | Wt 217.0 lb

## 2019-05-30 DIAGNOSIS — M24551 Contracture, right hip: Secondary | ICD-10-CM

## 2019-05-30 DIAGNOSIS — M999 Biomechanical lesion, unspecified: Secondary | ICD-10-CM

## 2019-05-30 NOTE — Patient Instructions (Signed)
Consider Mauri Reading Keep doing exercises Stay active Be safe See me in 8 weeks

## 2019-05-30 NOTE — Progress Notes (Signed)
[place] Phone: [phone] Subjective:   [name], am serving as a scribe for Dr. Hulan Saas. This visit occurred during the SARS-CoV-2 public health emergency.  Safety protocols were in place, including screening questions prior to the visit, additional usage of staff PPE, and extensive cleaning of exam room while observing appropriate contact time as indicated for disinfecting solutions.   I'm seeing this patient by the request  of:  [name], MD  CC: Neck and upper back pain, low back pain  [id number]  [place] [name] is a 59 y.o. male coming in with complaint of back pain. Patient last seen on 04/18/2019 for OMT. Patient states that he has been doing fine since last visit.  Unfortunately when looking up or trying to increase activity unfortunately pain seems to be worsening.      Past Medical History:  Diagnosis Date  . ADD (attention deficit disorder)   . Allergy   . Anxiety   . Arthritis   . Bipolar disorder ([place])   . Depression    DENIES, HAS ADD  . GERD (gastroesophageal reflux disease)    GASTRITIS,  . Hypertension   . 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 Kidney stones   . Pain    CHRONIC BACK  . Sleep apnea    CPAP  . Stroke [facility])    hemorrhagic   Past Surgical History:  Procedure Laterality Date  . COLONOSCOPY    . HERNIA REPAIR     Umbilical Hernia  . INGUINAL HERNIA REPAIR Right 09/25/2015   Procedure: HERNIA REPAIR INGUINAL ADULT;  Surgeon: Leonie Green, MD;  Location: [facility] ORS;  Service: General;  Laterality: Right;   Social History   Socioeconomic History  . Marital status: Single    Spouse name: Not on file  . Number of children: 0  . Years of education: 70  . Highest education level: Not on file  Occupational History  . Occupation: Hairdresser  Tobacco Use  . Smoking status: Former Smoker    Packs/day: 0.50    Years: 10.00   Pack years: 5.00    Types: Cigarettes    Quit date: 09/16/2013    Years since quitting: 5.7  . Smokeless tobacco: Never Used  Substance and Sexual Activity  . Alcohol use: Yes    Comment: One drink a night  . Drug use: Yes    Types: Marijuana  . Sexual activity: Yes    Birth control/protection: None  Other Topics Concern  . Not on file  Social History Narrative   ** Merged History Encounter **       ** Data from: 11/03/13 Enc Dept: [facility]       ** Data from: 05/15/14 Enc Dept: [facility]   Born in [name], [place] and raised in [name]. Currently resides in a private residence with his partner and roommate. Fun: Work Denies any religious beliefs effecting healthcare.      Social Determinants of Health   Financial Resource Strain:   . Difficulty of Paying Living Expenses: Not on file  Food Insecurity:   . Worried About Charity fundraiser in the Last Year: Not on file  . Ran Out of Food in the Last Year: Not on file  Transportation Needs:   . Lack of Transportation (Medical): Not on file  . Lack of Transportation (Non-Medical): Not on file  Physical Activity:   .  Days of Exercise per Week: Not on file  . Minutes of Exercise per Session: Not on file  Stress:   . Feeling of Stress : Not on file  Social Connections:   . Frequency of Communication with Friends and Family: Not on file  . Frequency of Social Gatherings with Friends and Family: Not on file  . Attends Religious Services: Not on file  . Active Member of Clubs or Organizations: Not on file  . Attends Archivist Meetings: Not on file  . Marital Status: Not on file   No Known Allergies Family History  Problem Relation Age of Onset  . Pancreatic cancer Mother   . Hypertension Mother   . Diabetes Mother   . Obesity Mother      Current Outpatient Medications (Cardiovascular):  .  lisinopril-hydrochlorothiazide (PRINZIDE,ZESTORETIC) 10-12.5 MG tablet, Take 1 tablet by mouth daily.   Current Outpatient Medications (Respiratory):  .  cetirizine (ZYRTEC) 10 MG tablet, Take 10 mg by mouth daily.  Current Outpatient Medications (Analgesics):  .  HYDROcodone-acetaminophen (NORCO) 5-325 MG tablet, Take 1-2 tablets by mouth every 4 (four) hours as needed for moderate pain. .  meloxicam (MOBIC) 15 MG tablet, Take 1 tablet (15 mg total) by mouth daily.   Current Outpatient Medications (Other):  Marland Kitchen  ALPRAZolam (XANAX) 1 MG tablet, Take 1 mg by mouth 2 (two) times daily as needed for anxiety.  Marland Kitchen  amphetamine-dextroamphetamine (ADDERALL) 20 MG tablet, Take 20 mg by mouth daily. .  pantoprazole (PROTONIX) 40 MG tablet, Take 40 mg by mouth daily. Marland Kitchen  tiZANidine (ZANAFLEX) 4 MG tablet, Take 1 tablet (4 mg total) by mouth at bedtime. .  traZODone (DESYREL) 100 MG tablet, Take 100 mg by mouth at bedtime.    Past medical history, social, surgical and family history all reviewed in electronic medical record.  No pertanent information unless stated regarding to the chief complaint.   Review of Systems:  No headache, visual changes, nausea, vomiting, diarrhea, constipation, dizziness, abdominal pain, skin rash, fevers, chills, night sweats, weight loss, swollen lymph nodes, body aches, joint swelling, chest pain, shortness of breath, mood changes. POSITIVE muscle aches  Objective  Blood pressure 110/76, pulse 90, height 5\' 11"  (1.803 m), weight 217 lb (98.4 kg), SpO2 97 %.   General: No apparent distress alert and oriented x3 mood and affect normal, dressed appropriately.  HEENT: Pupils equal, extraocular movements intact  Respiratory: Patient's speak in full sentences and does not appear short of breath  Cardiovascular: No lower extremity edema, non tender, no erythema  Skin: Warm dry intact with no signs of infection or rash on extremities or on axial skeleton.  Abdomen: Soft nontender  Neuro: Cranial nerves II through XII are intact, neurovascularly intact in all extremities with  2+ DTRs and 2+ pulses.  Lymph: No lymphadenopathy of posterior or anterior cervical chain or axillae bilaterally.  Gait normal with good balance and coordination.  MSK:  tender with full range of motion and good stability and symmetric strength and tone of shoulders, elbows, wrist, hip, knee and ankles bilaterally.  Patient's back exam does have some loss of lordosis.  Patient does have some tightness noted.  Diffusely tender to palpation in the paraspinal musculature.  Lacks last 5 degrees of flexion last 5 degrees of extension.  Osteopathic findings  T11 extended rotated and side bent left L flexed rotated and side bent right Sacrum right on right    Impression and Recommendations:     This case required  medical decision making of moderate complexity. The above documentation has been reviewed and is accurate and complete Lyndal Pulley, DO       Note: This dictation was prepared with Dragon dictation along with smaller phrase technology. Any transcriptional errors that result from this process are unintentional.

## 2019-05-30 NOTE — Assessment & Plan Note (Signed)
Continues to have tightness overall.  We discussed with patient about the importance of exercises regularly.  Patient does have the muscle relaxer for breakthrough pain at night.  Discussed core strengthening stability.  Responded fairly well to osteopathic manipulation.  Follow-up again in 6 to 8 weeks

## 2019-05-30 NOTE — Assessment & Plan Note (Signed)
Decision today to treat with OMT was based on Physical Exam  After verbal consent patient was treated with HVLA, ME, FPR techniques in , thoracic, lumbar and sacral areas  Patient tolerated the procedure well with improvement in symptoms  Patient given exercises, stretches and lifestyle modifications  See medications in patient instructions if given  Patient will follow up in 6-8 weeks

## 2019-06-24 ENCOUNTER — Other Ambulatory Visit: Payer: Self-pay

## 2019-06-24 ENCOUNTER — Ambulatory Visit: Payer: 59 | Admitting: Cardiology

## 2019-06-24 ENCOUNTER — Encounter: Payer: Self-pay | Admitting: Cardiology

## 2019-06-24 VITALS — BP 110/75 | HR 82 | Ht 71.0 in | Wt 215.4 lb

## 2019-06-24 DIAGNOSIS — Z7189 Other specified counseling: Secondary | ICD-10-CM

## 2019-06-24 DIAGNOSIS — Z8249 Family history of ischemic heart disease and other diseases of the circulatory system: Secondary | ICD-10-CM | POA: Diagnosis not present

## 2019-06-24 DIAGNOSIS — I10 Essential (primary) hypertension: Secondary | ICD-10-CM | POA: Diagnosis not present

## 2019-06-24 DIAGNOSIS — Z8673 Personal history of transient ischemic attack (TIA), and cerebral infarction without residual deficits: Secondary | ICD-10-CM | POA: Diagnosis not present

## 2019-06-24 DIAGNOSIS — R42 Dizziness and giddiness: Secondary | ICD-10-CM

## 2019-06-24 NOTE — Patient Instructions (Signed)
Medication Instructions:  Your Physician recommend you continue on your current medication as directed.    *If you need a refill on your cardiac medications before your next appointment, please call your pharmacy*  Lab Work: None  Testing/Procedures: Calcium Score 112[address] [place]: At [facility], you and your health needs are our priority.  As part of our continuing mission to provide you with exceptional heart care, we have created designated Provider Care Teams.  These Care Teams include your primary Cardiologist (physician) and Advanced Practice Providers (APPs -  Physician Assistants and Nurse Practitioners) who all work together to provide you with the care you need, when you need it.  Your next appointment:   2 year(s)  The format for your next appointment:   In Person  Provider:   Buford Dresser, MD

## 2019-06-24 NOTE — Progress Notes (Signed)
Cardiology Office Note:    Date:  06/24/2019   ID:  Roger [name], DOB 1961/03/27, MRN [id number]  PCP:  [name], MD  Cardiologist:  Buford Dresser, MD  Referring MD: [name], MD   CC: new patient consultation for presyncope.  History of Present Illness:    [name] is a 59 y.o. male with a hx of CVA (intraparenchymal right frontal lobe punctate hemorrhage with surrounding subarachnoid hemorrhage), hypertension who is seen as a new consult at the request of [name], MD for the evaluation and management of presyncope.  Note received and reviewed from visit with Dr. [name] on 06/13/19. No HPI available, but per assessment, referred to cardiology to exclude cardiac cause of presyncope. Labs including lipids, CMP reviewed as well. Lipids show Tchol 152, TG 150, HDL 30, LDL 95. CMP unremarkable except for glucose of 102.   Patient concerns today: father had a heart surgery, younger brother also has a heart condition. He is worried about his family history and CV risk.  05/13/19: got dizzy, saw triple vision. Went away on its own, lasted only seconds. Happened one year prior as well. One year ago, was sitting in a rocking chair when it came on. This time, was painting, stood up and had the sensation. No syncope. No chest pain or shortness of breath. No focal neuro symptoms.   Symptoms with prior stroke/SAH was 1 week of headache. Thought it was a migraine, kept trying to medicate through it.  Cardiovascular risk factors: Prior clinical ASCVD: no MI. 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 Denies hyperlipidemia, diabetes, chronic kidney disease Metabolic syndrome/Obesity: BMI 30 Chronic inflammatory conditions: none Tobacco use history: former smoker, smoked through ages [age]. Family history: doesn't have details from his father or brother. Father is 77, just had a pacemaker. Paternal uncle in upper 41s had a pacemaker. Younger  brother had unclear heart issues as well. Prior cardiac testing and/or incidental findings on other testing (ie coronary calcium): echo normal 2014. Social: Training and development officer, works as a Probation officer as well. Originally from [place].  Diet/exercise: cooks vegetarian, beans, rice. Walks a lot, gets >10,000 steps/day  Past Medical History:  Diagnosis Date  . ADD (attention deficit disorder)   . Allergy   . Anxiety   . Arthritis   . Bipolar disorder ([place])   . Depression    DENIES, HAS ADD  . GERD (gastroesophageal reflux disease)    GASTRITIS,  . Hypertension   . 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 Kidney stones   . Pain    CHRONIC BACK  . Sleep apnea    CPAP  . Stroke [facility])    hemorrhagic    Past Surgical History:  Procedure Laterality Date  . COLONOSCOPY    . HERNIA REPAIR     Umbilical Hernia  . INGUINAL HERNIA REPAIR Right 09/25/2015   Procedure: HERNIA REPAIR INGUINAL ADULT;  Surgeon: Leonie Green, MD;  Location: [facility] ORS;  Service: General;  Laterality: Right;    Current Medications: Current Outpatient Medications on File Prior to Visit  Medication Sig  . ALPRAZolam (XANAX) 1 MG tablet Take 1 mg by mouth 2 (two) times daily as needed for anxiety.   Marland Kitchen amphetamine-dextroamphetamine (ADDERALL) 20 MG tablet Take 20 mg by mouth daily.  . cetirizine (ZYRTEC) 10 MG tablet Take 10 mg by mouth daily.  Marland Kitchen HYDROcodone-acetaminophen (NORCO) 5-325 MG tablet Take 1-2 tablets by mouth every 4 (four) hours  as needed for moderate pain.  Marland Kitchen lisinopril-hydrochlorothiazide (PRINZIDE,ZESTORETIC) 10-12.5 MG tablet Take 1 tablet by mouth daily.  . meloxicam (MOBIC) 15 MG tablet Take 1 tablet (15 mg total) by mouth daily.  . pantoprazole (PROTONIX) 40 MG tablet Take 40 mg by mouth daily.  Marland Kitchen tiZANidine (ZANAFLEX) 4 MG tablet Take 1 tablet (4 mg total) by mouth at bedtime.  . traZODone (DESYREL) 100 MG tablet Take 100 mg by mouth at bedtime.   No current facility-administered  medications on file prior to visit.     Allergies:   Patient has no known allergies.   Social History   Tobacco Use  . Smoking status: Former Smoker    Packs/day: 0.50    Years: 10.00    Pack years: 5.00    Types: Cigarettes    Quit date: 09/16/2013    Years since quitting: 5.7  . Smokeless tobacco: Never Used  Substance Use Topics  . Alcohol use: Yes    Comment: One drink a night  . Drug use: Yes    Types: Marijuana    Family History: family history includes Diabetes in his mother; Hypertension in his mother; Obesity in his mother; Pancreatic cancer in his mother.  ROS:   Please see the history of present illness.  Additional pertinent ROS: Constitutional: Negative for chills, fever, night sweats, unintentional weight loss  HENT: Negative for ear pain and hearing loss.   Eyes: Negative for loss of vision and eye pain.  Respiratory: Negative for cough, sputum, wheezing.   Cardiovascular: See HPI. Gastrointestinal: Negative for abdominal pain, melena, and hematochezia.  Genitourinary: Negative for dysuria and hematuria.  Musculoskeletal: Negative for falls and myalgias.  Skin: Negative for itching and rash.  Neurological: Negative for focal weakness, focal sensory changes and loss of consciousness.  Endo/Heme/Allergies: Does not bruise/bleed easily.     EKGs/Labs/Other Studies Reviewed:    The following studies were reviewed today: Echo, carotid doppler from 2014  EKG:  EKG is personally reviewed.  The ekg ordered today demonstrates NSR, incomplete RBBB  Recent Labs: No results found for requested labs within last 8760 hours.  Recent Lipid Panel    Component Value Date/Time   CHOL 178 05/15/2014 0951   TRIG 214.0 (H) 05/15/2014 0951   HDL 26.60 (L) 05/15/2014 0951   CHOLHDL 7 05/15/2014 0951   VLDL 42.8 (H) 05/15/2014 0951   LDLDIRECT 92.2 05/15/2014 0951    Physical Exam:    VS:  BP 110/75   Pulse 82   Ht 5\' 11"  (1.803 m)   Wt 215 lb 6.4 oz (97.7 kg)    SpO2 95%   BMI 30.04 kg/m    Orthostatic VS for the past 24 hrs (Last 3 readings):  BP- Lying Pulse- Lying BP- Sitting Pulse- Sitting BP- Standing at 0 minutes Pulse- Standing at 0 minutes BP- Standing at 3 minutes Pulse- Standing at 3 minutes  06/24/19 1620 110/76 81 110/70 75 119/71 84 108/77 75    Wt Readings from Last 3 Encounters:  06/24/19 215 lb 6.4 oz (97.7 kg)  05/30/19 217 lb (98.4 kg)  04/18/19 218 lb (98.9 kg)    GEN: Well nourished, well developed in no acute distress HEENT: Normal, moist mucous membranes NECK: No JVD CARDIAC: regular rhythm, normal S1 and S2, no rubs or gallops. No murmurs. VASCULAR: Radial and DP pulses 2+ bilaterally. No carotid bruits RESPIRATORY:  Clear to auscultation without rales, wheezing or rhonchi  ABDOMEN: Soft, non-tender, non-distended MUSCULOSKELETAL:  Ambulates independently SKIN: Warm  and dry, no edema NEUROLOGIC:  Alert and oriented x 3. No focal neuro deficits noted. PSYCHIATRIC:  Normal affect    ASSESSMENT:    1. Episodic lightheadedness   2. Family history of heart disease   3. History of CVA (cerebrovascular accident)   4. Essential hypertension   5. Cardiac risk counseling   6. Counseling on health promotion and disease prevention    PLAN:    Presyncope: exceptionally rare, no full syncope. Has happened twice, about a year apart. No high risk features.  -ECG without high grade conduction disease -normal exam -not orthostatic -prior echo, carotid dopplers normal -does have prior history of CVA, but per review of 2014 hospital notes this was intraparenchymal hemorrhage with surrounding subarachnoid hemorrhage, not ischemia -would continue to monitor for symptoms. If becomes frequent or he has full syncope, he will call and we will see him back to discuss next steps. -will defer to PCP on whether benzodiazepine, tizanidine, trazodone, and adderall combination could potentially be involved. Appears this is a stable  regimen.  History of prior CVA: hemorrhagic, not ischemic per my review of 2014 records  Hypertension: well controlled today -continue lisinopril-HCTZ  Family history of heart disease: -we reviewed the pros/cons of calcium scores. A cardiac CT scan for coronary calcium is a non-invasive way of obtaining information about the presence, location and extent of calcified plaque in the coronary arteries--the vessels that supply oxygen-containing blood to the heart muscle. Calcified plaque results when there is a build-up of fat and other substances under the inner layer of the artery. This material can calcify which signals the presence of atherosclerosis, a disease of the vessel wall, also called coronary artery disease.  People with this disease have an increased risk for heart attacks. In addition, over time, progression of plaque build up (CAD) can narrow the arteries or even close off blood flow to the heart. Because calcium is a marker of CAD, the amount of calcium detected on a cardiac CT scan is a helpful prognostic tool.  -we reviewed the charts together which show the relationship between calcium score and 15 year all cause mortality -he is amenable, calcium score ordered  Cardiac risk counseling and prevention recommendations: -recommend heart healthy/Mediterranean diet, with whole grains, fruits, vegetable, fish, lean meats, nuts, and olive oil. Limit salt. -recommend moderate walking, 3-5 times/week for 30-50 minutes each session. Aim for at least 150 minutes.week. Goal should be pace of 3 miles/hours, or walking 1.5 miles in 30 minutes -recommend avoidance of tobacco products. Avoid excess alcohol.  Plan for follow up: 2 years or sooner PRN  Buford Dresser, MD, PhD [place]  [facility]    Medication Adjustments/Labs and Tests Ordered: Current medicines are reviewed at length with the patient today.  Concerns regarding medicines are outlined above.  Orders Placed This  Encounter  Procedures  . CT CARDIAC SCORING  . EKG 12-Lead   No orders of the defined types were placed in this encounter.   Patient Instructions  Medication Instructions:  Your Physician recommend you continue on your current medication as directed.    *If you need a refill on your cardiac medications before your next appointment, please call your pharmacy*  Lab Work: None  Testing/Procedures: Calcium Score 1126 [place]: At [facility], you and your health needs are our priority.  As part of our continuing mission to provide you with exceptional heart care, we have created designated Provider Care Teams.  These  Care Teams include your primary Cardiologist (physician) and Advanced Practice Providers (APPs -  Physician Assistants and Nurse Practitioners) who all work together to provide you with the care you need, when you need it.  Your next appointment:   2 year(s)  The format for your next appointment:   In Person  Provider:   Buford Dresser, MD     Signed, Buford Dresser, MD PhD 06/24/2019   [place]

## 2019-07-01 ENCOUNTER — Encounter: Payer: Self-pay | Admitting: Cardiology

## 2019-07-01 DIAGNOSIS — Z8249 Family history of ischemic heart disease and other diseases of the circulatory system: Secondary | ICD-10-CM | POA: Insufficient documentation

## 2019-07-01 DIAGNOSIS — R42 Dizziness and giddiness: Secondary | ICD-10-CM | POA: Insufficient documentation

## 2019-07-01 DIAGNOSIS — Z8673 Personal history of transient ischemic attack (TIA), and cerebral infarction without residual deficits: Secondary | ICD-10-CM | POA: Insufficient documentation

## 2019-07-11 ENCOUNTER — Other Ambulatory Visit: Payer: Self-pay

## 2019-07-11 ENCOUNTER — Ambulatory Visit (INDEPENDENT_AMBULATORY_CARE_PROVIDER_SITE_OTHER)
Admission: RE | Admit: 2019-07-11 | Discharge: 2019-07-11 | Disposition: A | Discharge status: Home / Self Care | Payer: Self-pay | Source: Ambulatory Visit | Attending: Cardiology | Admitting: Cardiology

## 2019-07-11 DIAGNOSIS — Z8249 Family history of ischemic heart disease and other diseases of the circulatory system: Secondary | ICD-10-CM

## 2019-07-25 ENCOUNTER — Ambulatory Visit (INDEPENDENT_AMBULATORY_CARE_PROVIDER_SITE_OTHER): Payer: 59 | Admitting: Family Medicine

## 2019-07-25 ENCOUNTER — Other Ambulatory Visit: Payer: Self-pay

## 2019-07-25 ENCOUNTER — Encounter: Payer: Self-pay | Admitting: Family Medicine

## 2019-07-25 VITALS — BP 100/80 | HR 68 | Ht 71.0 in | Wt 217.0 lb

## 2019-07-25 DIAGNOSIS — M999 Biomechanical lesion, unspecified: Secondary | ICD-10-CM

## 2019-07-25 DIAGNOSIS — M24551 Contracture, right hip: Secondary | ICD-10-CM

## 2019-07-25 NOTE — Patient Instructions (Signed)
Good to see you  Keep working on posture and stretching See me again in 6 weeks

## 2019-07-25 NOTE — Assessment & Plan Note (Signed)
Chronic problem :   interventions previously, including medication management: Osteopathic manipulation, meloxicam, Zanaflex, trazodone and continue all medications.   Interventions this visit: Osteopathic manipulation We discussed with patient the importance ergonomics, home exercises, icing regimen, and over-the-counter natural products.   Future considerations but will be based on evaluation and next visit: Discuss further changes in any medications such as increasing trazodone with the potential for changing meloxicam to another anti-inflammatory    Return to clinic: 4 to 8 weeks

## 2019-07-25 NOTE — Assessment & Plan Note (Signed)

## 2019-07-25 NOTE — Progress Notes (Signed)
[place] [address] [place] Phone: [phone] Subjective:   I Roger [name] am serving as a Education administrator for Dr. Hulan Saas.  This visit occurred during the SARS-CoV-2 public health emergency.  Safety protocols were in place, including screening questions prior to the visit, additional usage of staff PPE, and extensive cleaning of exam room while observing appropriate contact time as indicated for disinfecting solutions.   I'm seeing this patient by the request  of:  [name], MD  CC: Low back pain follow-up  [id number]  Roger [name] is a 59 y.o. male coming in with complaint of back pain. Last seen on 05/30/2019 for OMT. Patient states he is doing well.  Patient states that has not been doing the exercises as regularly.  Patient knows if he does do that it seems to be improving though.  Patient states that recently did get the immunization and feels like this is causing more increase in inflammation      Past Medical History:  Diagnosis Date  . ADD (attention deficit disorder)   . Allergy   . Anxiety   . Arthritis   . Bipolar disorder ([place])   . Depression    DENIES, HAS ADD  . GERD (gastroesophageal reflux disease)    GASTRITIS,  . Hypertension   . 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 Kidney stones   . Pain    CHRONIC BACK  . Sleep apnea    CPAP  . Stroke [facility])    hemorrhagic   Past Surgical History:  Procedure Laterality Date  . COLONOSCOPY    . HERNIA REPAIR     Umbilical Hernia  . INGUINAL HERNIA REPAIR Right 09/25/2015   Procedure: HERNIA REPAIR INGUINAL ADULT;  Surgeon: Leonie Green, MD;  Location: [facility] ORS;  Service: General;  Laterality: Right;   Social History   Socioeconomic History  . Marital status: Single    Spouse name: Not on file  . Number of children: 0  . Years of education: 53  . Highest education level: Not on file  Occupational History  . Occupation:  Hairdresser  Tobacco Use  . Smoking status: Former Smoker    Packs/day: 0.50    Years: 10.00    Pack years: 5.00    Types: Cigarettes    Quit date: 09/16/2013    Years since quitting: 5.8  . Smokeless tobacco: Never Used  Substance and Sexual Activity  . Alcohol use: Yes    Comment: One drink a night  . Drug use: Yes    Types: Marijuana  . Sexual activity: Yes    Birth control/protection: None  Other Topics Concern  . Not on file  Social History Narrative   ** Merged History Encounter **       ** Data from: 11/03/13 Enc Dept: [facility]       ** Data from: 05/15/14 Enc Dept: [facility]   Born in [place], [place] and raised in [place]. Currently resides in a private residence with his partner and roommate. Fun: Work Denies any religious beliefs effecting healthcare.      Social Determinants of Health   Financial Resource Strain:   . Difficulty of Paying Living Expenses:   Food Insecurity:   . Worried About Charity fundraiser in the Last Year:   . Arboriculturist in the Last Year:   Transportation Needs:   . Film/video editor (Medical):   Marland Kitchen  Lack of Transportation (Non-Medical):   Physical Activity:   . Days of Exercise per Week:   . Minutes of Exercise per Session:   Stress:   . Feeling of Stress :   Social Connections:   . Frequency of Communication with Friends and Family:   . Frequency of Social Gatherings with Friends and Family:   . Attends Religious Services:   . Active Member of Clubs or Organizations:   . Attends Archivist Meetings:   Marland Kitchen Marital Status:    No Known Allergies Family History  Problem Relation Age of Onset  . Pancreatic cancer Mother   . Hypertension Mother   . Diabetes Mother   . Obesity Mother      Current Outpatient Medications (Cardiovascular):  .  lisinopril-hydrochlorothiazide (PRINZIDE,ZESTORETIC) 10-12.5 MG tablet, Take 1 tablet by mouth daily.  Current Outpatient Medications (Respiratory):  .   cetirizine (ZYRTEC) 10 MG tablet, Take 10 mg by mouth daily.  Current Outpatient Medications (Analgesics):  .  HYDROcodone-acetaminophen (NORCO) 5-325 MG tablet, Take 1-2 tablets by mouth every 4 (four) hours as needed for moderate pain. .  meloxicam (MOBIC) 15 MG tablet, Take 1 tablet (15 mg total) by mouth daily.   Current Outpatient Medications (Other):  Marland Kitchen  ALPRAZolam (XANAX) 1 MG tablet, Take 1 mg by mouth 2 (two) times daily as needed for anxiety.  Marland Kitchen  amphetamine-dextroamphetamine (ADDERALL) 20 MG tablet, Take 20 mg by mouth daily. .  pantoprazole (PROTONIX) 40 MG tablet, Take 40 mg by mouth daily. Marland Kitchen  tiZANidine (ZANAFLEX) 4 MG tablet, Take 1 tablet (4 mg total) by mouth at bedtime. .  traZODone (DESYREL) 100 MG tablet, Take 100 mg by mouth at bedtime.   Reviewed prior external information including notes and imaging from  primary care provider As well as notes that were available from care everywhere and other healthcare systems.  Past medical history, social, surgical and family history all reviewed in electronic medical record.  No pertanent information unless stated regarding to the chief complaint.   Review of Systems:  No headache, visual changes, nausea, vomiting, diarrhea, constipation, dizziness, abdominal pain, skin rash, fevers, chills, night sweats, weight loss, swollen lymph nodes, , joint swelling, chest pain, shortness of breath, mood changes. POSITIVE muscle aches, body aches  Objective  Blood pressure 100/80, pulse 68, height 5\' 11"  (1.803 m), weight 217 lb (98.4 kg), SpO2 97 %.   General: No apparent distress alert and oriented x3 mood and affect normal, dressed appropriately.  HEENT: Pupils equal, extraocular movements intact  Respiratory: Patient's speak in full sentences and does not appear short of breath  Cardiovascular: No lower extremity edema, non tender, no erythema  Neuro: Cranial nerves II through XII are intact, neurovascularly intact in all  extremities with 2+ DTRs and 2+ pulses.  Gait normal with good balance and coordination.  MSK:  Non tender with full range of motion and good stability and symmetric strength and tone of shoulders, elbows, wrist, hip, knee and ankles bilaterally.  Back exam still show significant amount of tightness noted.  Patient states that the hip flexors seem to be tighter than usual.  Difficulty even with [name].  Unable to do full straight leg test secondary to tightness.  No radicular symptoms low.  Tightness noted in the paraspinal musculature lumbar spine bilaterally.  No spinous process tenderness.  Osteopathic findings  T9 extended rotated and side bent left L2 flexed rotated and side bent right Sacrum right on right    Impression and  Recommendations:     This case required medical decision making of moderate complexity. The above documentation has been reviewed and is accurate and complete Lyndal Pulley, DO       Note: This dictation was prepared with Dragon dictation along with smaller phrase technology. Any transcriptional errors that result from this process are unintentional.

## 2019-09-02 ENCOUNTER — Other Ambulatory Visit: Payer: Self-pay

## 2019-09-02 ENCOUNTER — Ambulatory Visit (INDEPENDENT_AMBULATORY_CARE_PROVIDER_SITE_OTHER): Payer: 59 | Admitting: Family Medicine

## 2019-09-02 ENCOUNTER — Encounter: Payer: Self-pay | Admitting: Family Medicine

## 2019-09-02 VITALS — BP 120/72 | HR 76 | Ht 71.0 in | Wt 216.0 lb

## 2019-09-02 DIAGNOSIS — M24551 Contracture, right hip: Secondary | ICD-10-CM | POA: Diagnosis not present

## 2019-09-02 DIAGNOSIS — M999 Biomechanical lesion, unspecified: Secondary | ICD-10-CM | POA: Diagnosis not present

## 2019-09-02 NOTE — Patient Instructions (Addendum)
Good to see you Arnica lotion for the bruise  Sorry for your loss See me in 7-8 weeks

## 2019-09-02 NOTE — Progress Notes (Signed)
[place] [address] [place] Phone: [phone] Subjective:   I [name] am serving as a Education administrator for Dr. Hulan Saas.  This visit occurred during the SARS-CoV-2 public health emergency.  Safety protocols were in place, including screening questions prior to the visit, additional usage of staff PPE, and extensive cleaning of exam room while observing appropriate contact time as indicated for disinfecting solutions.   I'm seeing this patient by the request  of:  [name], MD  CC: back and neck pain   [id number]  [name] is a 59 y.o. male coming in with complaint of neck and back pain. Last seen on 07/25/2019 for OMT. Patient states he is doing well. Here for OMT. Has not been doing exercises, did recently lose a good friend of his.       Past Medical History:  Diagnosis Date  . ADD (attention deficit disorder)   . Allergy   . Anxiety   . Arthritis   . Bipolar disorder ([place])   . Depression    DENIES, HAS ADD  . GERD (gastroesophageal reflux disease)    GASTRITIS,  . Hypertension   . 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 Kidney stones   . Pain    CHRONIC BACK  . Sleep apnea    CPAP  . Stroke [facility])    hemorrhagic   Past Surgical History:  Procedure Laterality Date  . COLONOSCOPY    . HERNIA REPAIR     Umbilical Hernia  . INGUINAL HERNIA REPAIR Right 09/25/2015   Procedure: HERNIA REPAIR INGUINAL ADULT;  Surgeon: Leonie Green, MD;  Location: [facility] ORS;  Service: General;  Laterality: Right;   Social History   Socioeconomic History  . Marital status: Single    Spouse name: Not on file  . Number of children: 0  . Years of education: 64  . Highest education level: Not on file  Occupational History  . Occupation: Hairdresser  Tobacco Use  . Smoking status: Former Smoker    Packs/day: 0.50    Years: 10.00    Pack years: 5.00    Types: Cigarettes    Quit date: 09/16/2013      Years since quitting: 5.9  . Smokeless tobacco: Never Used  Substance and Sexual Activity  . Alcohol use: Yes    Comment: One drink a night  . Drug use: Yes    Types: Marijuana  . Sexual activity: Yes    Birth control/protection: None  Other Topics Concern  . Not on file  Social History Narrative   ** Merged History Encounter **       ** Data from: 11/03/13 Enc Dept: [facility]       ** Data from: 05/15/14 Enc Dept: [facility]   Born in [place], [place] and raised in [place], [place]. Currently resides in a private residence with his partner and roommate. Fun: Work Denies any religious beliefs effecting healthcare.      Social Determinants of Health   Financial Resource Strain:   . Difficulty of Paying Living Expenses:   Food Insecurity:   . Worried About Charity fundraiser in the Last Year:   . Arboriculturist in the Last Year:   Transportation Needs:   . Film/video editor (Medical):   Marland Kitchen Lack of Transportation (Non-Medical):   Physical Activity:   . Days of Exercise per Week:   . Minutes of  Exercise per Session:   Stress:   . Feeling of Stress :   Social Connections:   . Frequency of Communication with Friends and Family:   . Frequency of Social Gatherings with Friends and Family:   . Attends Religious Services:   . Active Member of Clubs or Organizations:   . Attends Archivist Meetings:   Marland Kitchen Marital Status:    No Known Allergies Family History  Problem Relation Age of Onset  . Pancreatic cancer Mother   . Hypertension Mother   . Diabetes Mother   . Obesity Mother      Current Outpatient Medications (Cardiovascular):  .  lisinopril-hydrochlorothiazide (PRINZIDE,ZESTORETIC) 10-12.5 MG tablet, Take 1 tablet by mouth daily.  Current Outpatient Medications (Respiratory):  .  cetirizine (ZYRTEC) 10 MG tablet, Take 10 mg by mouth daily.  Current Outpatient Medications (Analgesics):  .  HYDROcodone-acetaminophen (NORCO) 5-325 MG tablet,  Take 1-2 tablets by mouth every 4 (four) hours as needed for moderate pain. .  meloxicam (MOBIC) 15 MG tablet, Take 1 tablet (15 mg total) by mouth daily.   Current Outpatient Medications (Other):  Marland Kitchen  ALPRAZolam (XANAX) 1 MG tablet, Take 1 mg by mouth 2 (two) times daily as needed for anxiety.  Marland Kitchen  amphetamine-dextroamphetamine (ADDERALL) 20 MG tablet, Take 20 mg by mouth daily. .  pantoprazole (PROTONIX) 40 MG tablet, Take 40 mg by mouth daily. Marland Kitchen  tiZANidine (ZANAFLEX) 4 MG tablet, Take 1 tablet (4 mg total) by mouth at bedtime. .  traZODone (DESYREL) 100 MG tablet, Take 100 mg by mouth at bedtime.   Reviewed prior external information including notes and imaging from  primary care provider As well as notes that were available from care everywhere and other healthcare systems.  Past medical history, social, surgical and family history all reviewed in electronic medical record.  No pertanent information unless stated regarding to the chief complaint.   Review of Systems:  No headache, visual changes, nausea, vomiting, diarrhea, constipation, dizziness, abdominal pain, skin rash, fevers, chills, night sweats, weight loss, swollen lymph nodes, body aches, joint swelling, chest pain, shortness of breath, mood changes. POSITIVE muscle aches, body aches   Objective  Blood pressure 120/72, pulse 76, height 5\' 11"  (1.803 m), weight 216 lb (98 kg), SpO2 96 %.   General: No apparent distress alert and oriented x3 mood and affect normal, dressed appropriately.  HEENT: Pupils equal, extraocular movements intact  Respiratory: Patient's speak in full sentences and does not appear short of breath  Cardiovascular: No lower extremity edema, non tender, no erythema  Neuro: Cranial nerves II through XII are intact, neurovascularly intact in all extremities with 2+ DTRs and 2+ pulses.  Gait normal with good balance and coordination.  MSK:  Non tender with full range of motion and good stability and  symmetric strength and tone of shoulders, elbows, wrist, hip, knee and ankles bilaterally.mild arthritis.  Low back exam TTP diffusely, loss of range of motion in all planes.  Negative SLT   Osteopathic findings  T3 extended rotated and side bent right inhaled rib T9 extended rotated and side bent left L2 flexed rotated and side bent right Sacrum right on right      Impression and Recommendations:     This case required medical decision making of moderate complexity. The above documentation has been reviewed and is accurate and complete Lyndal Pulley, DO       Note: This dictation was prepared with Dragon dictation along with smaller phrase technology.  Any transcriptional errors that result from this process are unintentional.

## 2019-09-03 ENCOUNTER — Encounter: Payer: Self-pay | Admitting: Family Medicine

## 2019-09-03 NOTE — Assessment & Plan Note (Signed)
continued tightness,m discussed HEP, discussed weight loss, encourage more exercises. Meloxicam long term discuss.  RTC in 6 weeks

## 2019-10-17 ENCOUNTER — Other Ambulatory Visit: Payer: Self-pay

## 2019-10-17 ENCOUNTER — Ambulatory Visit (INDEPENDENT_AMBULATORY_CARE_PROVIDER_SITE_OTHER): Payer: 59 | Admitting: Family Medicine

## 2019-10-17 ENCOUNTER — Encounter: Payer: Self-pay | Admitting: Family Medicine

## 2019-10-17 VITALS — BP 124/78 | HR 74 | Ht 71.0 in | Wt 217.0 lb

## 2019-10-17 DIAGNOSIS — M999 Biomechanical lesion, unspecified: Secondary | ICD-10-CM

## 2019-10-17 DIAGNOSIS — M24551 Contracture, right hip: Secondary | ICD-10-CM

## 2019-10-17 NOTE — Assessment & Plan Note (Signed)
Continue tightness noted.  Discussed with patient in great length about icing regimen, home exercise, which activities to do which wants to avoid.  Increase activity slowly.  Follow-up again 4 to 8 weeks.

## 2019-10-17 NOTE — Progress Notes (Signed)
[name] Phone: [phone] Subjective:   [name], am serving as a scribe for Dr. Hulan Saas. This visit occurred during the SARS-CoV-2 public health emergency.  Safety protocols were in place, including screening questions prior to the visit, additional usage of staff PPE, and extensive cleaning of exam room while observing appropriate contact time as indicated for disinfecting solutions.   I'm seeing this patient by the request  of:  Roger Small, MD  CC: Neck and back pain follow-up  [id number]  Roger [name] is a 59 y.o. male coming in with complaint of back pain. Last seen on OMT 09/02/2019. Patient states doing relatively well overall but does have some tightness.  Still not doing the exercises regularly secondary to him having the loss of his friend and recently an illness in someone else.    Past Medical History:  Diagnosis Date  . ADD (attention deficit disorder)   . Allergy   . Anxiety   . Arthritis   . Bipolar disorder ([place])   . Depression    DENIES, HAS ADD  . GERD (gastroesophageal reflux disease)    GASTRITIS,  . Hypertension   . 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 Kidney stones   . Pain    CHRONIC BACK  . Sleep apnea    CPAP  . Stroke [facility])    hemorrhagic   Past Surgical History:  Procedure Laterality Date  . COLONOSCOPY    . HERNIA REPAIR     Umbilical Hernia  . INGUINAL HERNIA REPAIR Right 09/25/2015   Procedure: HERNIA REPAIR INGUINAL ADULT;  Surgeon: Leonie Green, MD;  Location: [facility] ORS;  Service: General;  Laterality: Right;   Social History   Socioeconomic History  . Marital status: Single    Spouse name: Not on file  . Number of children: 0  . Years of education: 54  . Highest education level: Not on file  Occupational History  . Occupation: Hairdresser  Tobacco Use  . Smoking status: Former Smoker    Packs/day: 0.50     Years: 10.00    Pack years: 5.00    Types: Cigarettes    Quit date: 09/16/2013    Years since quitting: 6.0  . Smokeless tobacco: Never Used  Substance and Sexual Activity  . Alcohol use: Yes    Comment: One drink a night  . Drug use: Yes    Types: Marijuana  . Sexual activity: Yes    Birth control/protection: None  Other Topics Concern  . Not on file  Social History Narrative   ** Merged History Encounter **       ** Data from: 11/03/13 Enc Dept: [facility]       ** Data from: 05/15/14 Enc Dept: [facility]   Born in [name] and raised in [name]. Currently resides in a private residence with his partner and roommate. Fun: Work Denies any religious beliefs effecting healthcare.      Social Determinants of Health   Financial Resource Strain:   . Difficulty of Paying Living Expenses:   Food Insecurity:   . Worried About Charity fundraiser in the Last Year:   . Arboriculturist in the Last Year:   Transportation Needs:   . Film/video editor (Medical):   Marland Kitchen Lack of Transportation (Non-Medical):   Physical Activity:   . Days of Exercise per Week:   .  Minutes of Exercise per Session:   Stress:   . Feeling of Stress :   Social Connections:   . Frequency of Communication with Friends and Family:   . Frequency of Social Gatherings with Friends and Family:   . Attends Religious Services:   . Active Member of Clubs or Organizations:   . Attends Archivist Meetings:   Marland Kitchen Marital Status:    [name] Known Allergies Family History  Problem Relation Age of Onset  . Pancreatic cancer Mother   . Hypertension Mother   . Diabetes Mother   . Obesity Mother      Current Outpatient Medications (Cardiovascular):  .  lisinopril-hydrochlorothiazide (PRINZIDE,ZESTORETIC) 10-12.5 MG tablet, Take 1 tablet by mouth daily.  Current Outpatient Medications (Respiratory):  .  cetirizine (ZYRTEC) 10 MG tablet, Take 10 mg by mouth daily.  Current Outpatient  Medications (Analgesics):  .  HYDROcodone-acetaminophen (NORCO) 5-325 MG tablet, Take 1-2 tablets by mouth every 4 (four) hours as needed for moderate pain. .  meloxicam (MOBIC) 15 MG tablet, Take 1 tablet (15 mg total) by mouth daily.   Current Outpatient Medications (Other):  Marland Kitchen  ALPRAZolam (XANAX) 1 MG tablet, Take 1 mg by mouth 2 (two) times daily as needed for anxiety.  Marland Kitchen  amphetamine-dextroamphetamine (ADDERALL) 20 MG tablet, Take 20 mg by mouth daily. .  pantoprazole (PROTONIX) 40 MG tablet, Take 40 mg by mouth daily. Marland Kitchen  tiZANidine (ZANAFLEX) 4 MG tablet, Take 1 tablet (4 mg total) by mouth at bedtime. .  traZODone (DESYREL) 100 MG tablet, Take 100 mg by mouth at bedtime.   Reviewed prior external information including notes and imaging from  primary care provider As well as notes that were available from care everywhere and other healthcare systems.  Past medical history, social, surgical and family history all reviewed in electronic medical record.  [name] pertanent information unless stated regarding to the chief complaint.   Review of Systems:  [name] headache, visual changes, nausea, vomiting, diarrhea, constipation, dizziness, abdominal pain, skin rash, fevers, chills, night sweats, weight loss, swollen lymph nodes, body aches, joint swelling, chest pain, shortness of breath, mood changes. POSITIVE muscle aches  Objective  Blood pressure 124/78, pulse 74, height 5\' 11"  (1.803 m), weight 217 lb (98.4 kg), SpO2 97 %.   General: [name] apparent distress alert and oriented x3 mood and affect normal, dressed appropriately.  HEENT: Pupils equal, extraocular movements intact  Respiratory: Patient's speak in full sentences and does not appear short of breath  Cardiovascular: [name] lower extremity edema, non tender, [name] erythema  Neuro: Cranial nerves II through XII are intact, neurovascularly intact in all extremities with 2+ DTRs and 2+ pulses.  Gait normal with good balance and coordination.    MSK:  tender with full range of motion and good stability and symmetric strength and tone of shoulders, elbows, wrist, hip, knee and ankles bilaterally.   Patient's back exam does have significant tightness of the hip flexors bilaterally right greater than left. FABER positive right neg SLT    Osteopathic findings  T8 extended rotated and side bent left L1 flexed rotated and side bent right Sacrum right on right     Impression and Recommendations:     The above documentation has been reviewed and is accurate and complete Lyndal Pulley, DO       Note: This dictation was prepared with Dragon dictation along with smaller phrase technology. Any transcriptional errors that result from this process are unintentional.

## 2019-11-28 ENCOUNTER — Ambulatory Visit (INDEPENDENT_AMBULATORY_CARE_PROVIDER_SITE_OTHER): Payer: 59

## 2019-11-28 ENCOUNTER — Encounter: Payer: Self-pay | Admitting: Family Medicine

## 2019-11-28 ENCOUNTER — Ambulatory Visit (INDEPENDENT_AMBULATORY_CARE_PROVIDER_SITE_OTHER): Payer: 59 | Admitting: Family Medicine

## 2019-11-28 ENCOUNTER — Other Ambulatory Visit: Payer: Self-pay

## 2019-11-28 VITALS — BP 118/82 | HR 80 | Ht 71.0 in | Wt 219.0 lb

## 2019-11-28 DIAGNOSIS — R0789 Other chest pain: Secondary | ICD-10-CM | POA: Diagnosis not present

## 2019-11-28 DIAGNOSIS — M999 Biomechanical lesion, unspecified: Secondary | ICD-10-CM | POA: Diagnosis not present

## 2019-11-28 DIAGNOSIS — M24551 Contracture, right hip: Secondary | ICD-10-CM | POA: Diagnosis not present

## 2019-11-28 NOTE — Patient Instructions (Addendum)
Chest xray Scapular exercises 3x a week Keep hands in peripheral vision See me again in 6 weeks

## 2019-11-28 NOTE — Progress Notes (Signed)
[place] Phone: [phone] Subjective:   [name], am serving as a scribe for Dr. Hulan Saas. This visit occurred during the SARS-CoV-2 public health emergency.  Safety protocols were in place, including screening questions prior to the visit, additional usage of staff PPE, and extensive cleaning of exam room while observing appropriate contact time as indicated for disinfecting solutions.   I'm seeing this patient by the request  of:  [name], MD  CC: Low back exam follow-up  [id number]  VIGNESH WILLERT is a 59 y.o. male coming in with complaint of back and neck pain. OMT 10/17/2019. Patient states that he has been doing fine. No flare ups since last visit. Always has pain in right scapula. Walks for exercise and has pain in scapula that prevents him from going further.  Medications patient has been prescribed: Meloxicam  Taking: Intermittently         Reviewed prior external information including notes and imaging from previsou exam, outside providers and external EMR if available.   As well as notes that were available from care everywhere and other healthcare systems.  Past medical history, social, surgical and family history all reviewed in electronic medical record.  No pertanent information unless stated regarding to the chief complaint.   Past Medical History:  Diagnosis Date  . ADD (attention deficit disorder)   . Allergy   . Anxiety   . Arthritis   . Bipolar disorder ([place])   . Depression    DENIES, HAS ADD  . GERD (gastroesophageal reflux disease)    GASTRITIS,  . Hypertension   . 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 Kidney stones   . Pain    CHRONIC BACK  . Sleep apnea    CPAP  . Stroke [facility])    hemorrhagic    No Known Allergies   Review of Systems:  No headache, visual changes, nausea, vomiting, diarrhea, constipation, dizziness, abdominal  pain, skin rash, fevers, chills, night sweats, weight loss, swollen lymph nodes, body aches, joint swelling, chest pain, shortness of breath, mood changes. POSITIVE muscle aches  Objective  Blood pressure 118/82, pulse 80, height 5\' 11"  (1.803 m), weight (!) 219 lb (99.3 kg), SpO2 96 %.   General: No apparent distress alert and oriented x3 mood and affect normal, dressed appropriately.  HEENT: Pupils equal, extraocular movements intact  Respiratory: Patient's speak in full sentences and does not appear short of breath  Cardiovascular: No lower extremity edema, non tender, no erythema  Neuro: Cranial nerves II through XII are intact, neurovascularly intact in all extremities with 2+ DTRs and 2+ pulses.  Gait normal with good balance and coordination.  MSK:  Non tender with full range of motion and good stability and symmetric strength and tone of shoulders, elbows, wrist, hip, knee and ankles bilaterally.  Back -poor core strength.  Discussed with patient significant increase in tightness of the FABER test bilaterally.  Patient does have tenderness to palpation.  Osteopathic findings  T9 extended rotated and side bent left L2 flexed rotated and side bent right Sacrum right on right       Assessment and Plan:  Hip flexor tightness Chronic problem with exacerbation.  Discussed Zanaflex and other prescription medications like the trazodone and meloxicam.  Continue tightness.  Discussed which activities to do which wants to avoid.  Discussed avoiding certain activities, icing regimen, home exercise, patient will follow up with  me again in 4 to 8 weeks.    Nonallopathic problems  Decision today to treat with OMT was based on Physical Exam  After verbal consent patient was treated with HVLA, ME, FPR techniques in  thoracic, lumbar, and sacral  areas  Patient tolerated the procedure well with improvement in symptoms  Patient given exercises, stretches and lifestyle modifications  See  medications in patient instructions if given  Patient will follow up in 4-8 weeks      The above documentation has been reviewed and is accurate and complete Lyndal Pulley, DO       Note: This dictation was prepared with Dragon dictation along with smaller phrase technology. Any transcriptional errors that result from this process are unintentional.

## 2019-11-28 NOTE — Assessment & Plan Note (Addendum)
Chronic problem with exacerbation.  Discussed Zanaflex and other prescription medications like the trazodone and meloxicam.  Continue tightness.  Discussed which activities to do which wants to avoid.  Discussed avoiding certain activities, icing regimen, home exercise, patient will follow up with me again in 4 to 8 weeks.

## 2020-01-20 NOTE — Progress Notes (Signed)
[place] Phone: [phone] Subjective:   [name], am serving as a scribe for Dr. Hulan Saas. This visit occurred during the SARS-CoV-2 public health emergency.  Safety protocols were in place, including screening questions prior to the visit, additional usage of staff PPE, and extensive cleaning of exam room while observing appropriate contact time as indicated for disinfecting solutions.   I'm seeing this patient by the request  of:  Roger Small, MD  CC: Low back and neck pain follow-up  [id number]  Roger [name] is a 59 y.o. male coming in with complaint of back and neck pain. OMT 11/28/2019. Patient states overall continues to have discomfort and pain as well.  Pain seems more of the lower back.  Still worse with certain standing as well.  Has responded well to manipulation.  Taking the meloxicam and Zanaflex intermittently.  [name] new symptoms just worsening of previous symptoms.  Medications patient has been prescribed: Zanaflex and Meloxicam         Reviewed prior external information including notes and imaging from previsou exam, outside providers and external EMR if available.   As well as notes that were available from care everywhere and other healthcare systems.  Past medical history, social, surgical and family history all reviewed in electronic medical record.  [name] pertanent information unless stated regarding to the chief complaint.   Past Medical History:  Diagnosis Date  . ADD (attention deficit disorder)   . Allergy   . Anxiety   . Arthritis   . Bipolar disorder ([place])   . Depression    DENIES, HAS ADD  . GERD (gastroesophageal reflux disease)    GASTRITIS,  . Hypertension   . 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 Kidney stones   . Pain    CHRONIC BACK  . Sleep apnea    CPAP  . Stroke [facility])    hemorrhagic    [name] Known Allergies   Review of  Systems:  [name] headache, visual changes, nausea, vomiting, diarrhea, constipation, dizziness, abdominal pain, skin rash, fevers, chills, night sweats, weight loss, swollen lymph nodes, body aches, joint swelling, chest pain, shortness of breath, mood changes. POSITIVE muscle aches, having difficulty with sleep  Objective  Blood pressure 108/74, pulse 75, height 5\' 11"  (1.803 m), weight 220 lb (99.8 kg), SpO2 96 %.   General: [name] apparent distress alert and oriented x3 mood and affect normal, dressed appropriately.  HEENT: Pupils equal, extraocular movements intact  Respiratory: Patient's speak in full sentences and does not appear short of breath  Cardiovascular: [name] lower extremity edema, non tender, [name] erythema  Neuro: Cranial nerves II through XII are intact, neurovascularly intact in all extremities with 2+ DTRs and 2+ pulses.  Gait normal with good balance and coordination.  MSK:  Non tender with full range of motion and good stability and symmetric strength and tone of shoulders, elbows, wrist, hip, knee and ankles bilaterally.  Back -overweight with poor core strength.  Tenderness to palpation in the paraspinal musculature of the lumbar spine diffusely on both sides.  Patient has tightness with FABER test.  Decreased range of motion of at least 5 degrees in all planes.  Osteopathic findings  T6 extended rotated and side bent left L1 flexed rotated and side bent right Sacrum right on right       Assessment and Plan:  Obesity, morbid Encourage weight loss.  Likely some of  them causing some of the sleep difficulty as well and the sleep apnea.  Sleep apnea Patient has not had further evaluation for quite some time and states he is having increasing somnolence.  Will have patient tested referred today   Nonallopathic problems  Decision today to treat with OMT was based on Physical Exam  After verbal consent patient was treated with HVLA, ME, FPR techniques in  thoracic, lumbar, and  sacral  areas  Patient tolerated the procedure well with improvement in symptoms  Patient given exercises, stretches and lifestyle modifications  See medications in patient instructions if given  Patient will follow up in 4-8 weeks      The above documentation has been reviewed and is accurate and complete Lyndal Pulley, DO       Note: This dictation was prepared with Dragon dictation along with smaller phrase technology. Any transcriptional errors that result from this process are unintentional.

## 2020-01-23 ENCOUNTER — Other Ambulatory Visit: Payer: Self-pay

## 2020-01-23 ENCOUNTER — Ambulatory Visit (INDEPENDENT_AMBULATORY_CARE_PROVIDER_SITE_OTHER): Payer: 59 | Admitting: Family Medicine

## 2020-01-23 ENCOUNTER — Encounter: Payer: Self-pay | Admitting: Family Medicine

## 2020-01-23 VITALS — BP 108/74 | HR 75 | Ht 71.0 in | Wt 220.0 lb

## 2020-01-23 DIAGNOSIS — G4730 Sleep apnea, unspecified: Secondary | ICD-10-CM

## 2020-01-23 DIAGNOSIS — M24551 Contracture, right hip: Secondary | ICD-10-CM | POA: Diagnosis not present

## 2020-01-23 DIAGNOSIS — M999 Biomechanical lesion, unspecified: Secondary | ICD-10-CM | POA: Diagnosis not present

## 2020-01-23 NOTE — Assessment & Plan Note (Signed)
Encourage weight loss.  Likely some of them causing some of the sleep difficulty as well and the sleep apnea.

## 2020-01-23 NOTE — Assessment & Plan Note (Signed)
Patient has not had further evaluation for quite some time and states he is having increasing somnolence.  Will have patient tested referred today

## 2020-01-23 NOTE — Assessment & Plan Note (Signed)
Continue tightness.  Does have some mild underlying arthritic changes that are likely contributing as well.  Chronic problem with exacerbation.  Discussed the medications like the meloxicam but encouraged to monitor closely with patient having the intraparenchymal hemorrhage but patient was on developing multiple other problems at that time.  Patient is continuing with the muscle relaxer at night.  Follow-up with me again 4- 8 weeks

## 2020-01-23 NOTE — Patient Instructions (Signed)
Pulmonology will call you Otherwise not too bad See me again in 6-7 weeks

## 2020-03-05 ENCOUNTER — Other Ambulatory Visit: Payer: Self-pay

## 2020-03-05 ENCOUNTER — Ambulatory Visit (INDEPENDENT_AMBULATORY_CARE_PROVIDER_SITE_OTHER): Payer: 59 | Admitting: Family Medicine

## 2020-03-05 ENCOUNTER — Encounter: Payer: Self-pay | Admitting: Family Medicine

## 2020-03-05 VITALS — BP 120/84 | HR 71 | Ht 71.0 in | Wt 223.0 lb

## 2020-03-05 DIAGNOSIS — M24551 Contracture, right hip: Secondary | ICD-10-CM

## 2020-03-05 DIAGNOSIS — M999 Biomechanical lesion, unspecified: Secondary | ICD-10-CM | POA: Diagnosis not present

## 2020-03-05 NOTE — Assessment & Plan Note (Addendum)
Chronic problem with exacerbation.  Discussed the Zanaflex at night.  Continue tightness.  Discussed posture and ergonomics, discussed with patient about stretching mechanics.  Discussed increasing activity slowly.  Follow-up again in 4 8 weeks

## 2020-03-05 NOTE — Progress Notes (Signed)
[place] [address] [place] Phone: [phone] Subjective:   I Roger [name] am serving as a Education administrator for Dr. Hulan Saas.  This visit occurred during the SARS-CoV-2 public health emergency.  Safety protocols were in place, including screening questions prior to the visit, additional usage of staff PPE, and extensive cleaning of exam room while observing appropriate contact time as indicated for disinfecting solutions.   I'm seeing this patient by the request  of:  [name], MD  CC: Low back pain follow-up  [id number]  Roger [name] is a 59 y.o. male coming in with complaint of back and neck pain. OMT 01/23/2020. Patient states he is doing well.  Chronic tightness still noted.  Patient now has had another loss in his family that is contributing to him not losing weight as well as not doing the exercises regularly.  Medications patient has been prescribed: Meloxicam, Zanaflex  Taking: Yes         Reviewed prior external information including notes and imaging from previsou exam, outside providers and external EMR if available.   As well as notes that were available from care everywhere and other healthcare systems.  Past medical history, social, surgical and family history all reviewed in electronic medical record.  No pertanent information unless stated regarding to the chief complaint.   Past Medical History:  Diagnosis Date  . ADD (attention deficit disorder)   . Allergy   . Anxiety   . Arthritis   . Bipolar disorder ([place])   . Depression    DENIES, HAS ADD  . GERD (gastroesophageal reflux disease)    GASTRITIS,  . Hypertension   . 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 Kidney stones   . Pain    CHRONIC BACK  . Sleep apnea    CPAP  . Stroke [facility])    hemorrhagic    No Known Allergies   Review of Systems:  No headache, visual changes, nausea, vomiting, diarrhea, constipation,  dizziness, abdominal pain, skin rash, fevers, chills, night sweats, weight loss, swollen lymph nodes,  joint swelling, chest pain, shortness of breath, mood changes. POSITIVE muscle aches, body aches  Objective  Blood pressure 120/84, pulse 71, height 5\' 11"  (1.803 m), weight 223 lb (101.2 kg), SpO2 97 %.   General: No apparent distress alert and oriented x3 mood and affect normal, dressed appropriately.  Patient does have a fairly flat affect today different than his usual HEENT: Pupils equal, extraocular movements intact  Respiratory: Patient's speak in full sentences and does not appear short of breath  Cardiovascular: No lower extremity edema, non tender, no erythema  Gait normal with good balance and coordination.  MSK: Mild tender with full range of motion and good stability and symmetric strength and tone of shoulders, elbows, wrist, hip, knee and ankles bilaterally.  Low back shows the patient does have poor core strength patient tender to palpation in the paraspinal musculature.  Significant tightness of the hip flexor still noted.  Osteopathic findings   T7 extended rotated and side bent left L2 flexed rotated and side bent right Sacrum right on right       Assessment and Plan:  Hip flexor tightness Continue tightness.  Discussed posture and ergonomics, discussed with patient about stretching mechanics.  Discussed increasing activity slowly.  Follow-up again in 4 to 8 weeks    Nonallopathic problems  Decision today to treat with OMT was based on Physical Exam  After verbal consent patient was treated with HVLA, ME, FPR techniques in  thoracic, lumbar, and sacral  areas  Patient tolerated the procedure well with improvement in symptoms  Patient given exercises, stretches and lifestyle modifications  See medications in patient instructions if given  Patient will follow up in 8 weeks      The above documentation has been reviewed and is accurate and complete  Roger Pulley, DO       Note: This dictation was prepared with Dragon dictation along with smaller phrase technology. Any transcriptional errors that result from this process are unintentional.

## 2020-03-05 NOTE — Patient Instructions (Signed)
Good to see you See me again in 8-10 weeks 

## 2020-05-07 ENCOUNTER — Ambulatory Visit: Payer: 59 | Admitting: Family Medicine

## 2020-05-11 NOTE — Progress Notes (Signed)
[place] Phone: [phone] Subjective:   [name], am serving as a scribe for Dr. Hulan Saas. This visit occurred during the SARS-CoV-2 public health emergency.  Safety protocols were in place, including screening questions prior to the visit, additional usage of staff PPE, and extensive cleaning of exam room while observing appropriate contact time as indicated for disinfecting solutions.   I'm seeing this patient by the request  of:  [name], MD  CC: Back pain follow-up  [id number]  Roger [name] is a 60 y.o. male coming in with complaint of back and neck pain. OMT 03/05/2020. Patient states that he has not had any issues with his back since last visit.  Patient has been doing relatively well overall.  Not taking the medications on a regular basis.  Patient feels like he has been doing relatively well.  Still if he does a lot of standing at work she notices that tightness in the hip flexors.  No radicular symptoms.  Medications patient has been prescribed: Zanaflex previously  Taking: No         Reviewed prior external information including notes and imaging from previsou exam, outside providers and external EMR if available.   As well as notes that were available from care everywhere and other healthcare systems.  Past medical history, social, surgical and family history all reviewed in electronic medical record.  No pertanent information unless stated regarding to the chief complaint.   Past Medical History:  Diagnosis Date  . ADD (attention deficit disorder)   . Allergy   . Anxiety   . Arthritis   . Bipolar disorder ([place])   . Depression    DENIES, HAS ADD  . GERD (gastroesophageal reflux disease)    GASTRITIS,  . Hypertension   . 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 Kidney stones   . Pain    CHRONIC BACK  . Sleep apnea    CPAP  . Stroke [facility])     hemorrhagic    No Known Allergies   Review of Systems:  No headache, visual changes, nausea, vomiting, diarrhea, constipation, dizziness, abdominal pain, skin rash, fevers, chills, night sweats, weight loss, swollen lymph nodes,  joint swelling, chest pain, shortness of breath, mood changes. POSITIVE muscle aches, body aches  Objective  Blood pressure 128/78, pulse 77, height 5\' 11"  (1.803 m), weight 221 lb (100.2 kg), SpO2 97 %.   General: No apparent distress alert and oriented x3 mood and affect normal, dressed appropriately.  HEENT: Pupils equal, extraocular movements intact  Respiratory: Patient's speak in full sentences and does not appear short of breath  Cardiovascular: No lower extremity edema, non tender, no erythema  Low back exam still shows tightness especially of the hip flexors bilaterally.  Loss of lordosis.  Patient does have tenderness to palpation of the paraspinal musculature of the lumbar spine right greater than left.  Mild tightness with FABER test bilaterally.  Osteopathic findings T8 extended rotated and side bent right L5 flexed rotated and side bent left Sacrum right on right       Assessment and Plan: Hip flexor tightness Hip flexor tightness noted.  Discussed posture and ergonomics, discussed which activities to do which wants to avoid.  Patient has not taken any of the medications that were prescribed previously and will take them off of his medication list at this time.  Patient will continue to stay active.  Can change to 3 months follow-ups.     Nonallopathic problems  Decision today to treat with OMT was based on Physical Exam  After verbal consent patient was treated with HVLA, ME, FPR techniques in  thoracic, lumbar, and sacral  areas  Patient tolerated the procedure well with improvement in symptoms  Patient given exercises, stretches and lifestyle modifications  See medications in patient instructions if given  Patient will follow up  in 12 weeks      The above documentation has been reviewed and is accurate and complete Roger Pulley, DO       Note: This dictation was prepared with Dragon dictation along with smaller phrase technology. Any transcriptional errors that result from this process are unintentional.

## 2020-05-14 ENCOUNTER — Encounter: Payer: Self-pay | Admitting: Family Medicine

## 2020-05-14 ENCOUNTER — Other Ambulatory Visit: Payer: Self-pay

## 2020-05-14 ENCOUNTER — Ambulatory Visit (INDEPENDENT_AMBULATORY_CARE_PROVIDER_SITE_OTHER): Payer: 59 | Admitting: Family Medicine

## 2020-05-14 VITALS — BP 128/78 | HR 77 | Ht 71.0 in | Wt 221.0 lb

## 2020-05-14 DIAGNOSIS — M999 Biomechanical lesion, unspecified: Secondary | ICD-10-CM

## 2020-05-14 DIAGNOSIS — M24551 Contracture, right hip: Secondary | ICD-10-CM

## 2020-05-14 NOTE — Assessment & Plan Note (Signed)
Hip flexor tightness noted.  Discussed posture and ergonomics, discussed which activities to do which wants to avoid.  Patient has not taken any of the medications that were prescribed previously and will take them off of his medication list at this time.  Patient will continue to stay active.  Can change to 3 months follow-ups.

## 2020-05-14 NOTE — Patient Instructions (Signed)
Good to see you Tight overall Do the stretches  See me in 3 months

## 2020-09-20 NOTE — Progress Notes (Signed)
Roger [name] Phone: [phone] Subjective:   Roger [name], am serving as a scribe for Roger [name]. This visit occurred during the SARS-CoV-2 public health emergency.  Safety protocols were in place, including screening questions prior to the visit, additional usage of staff PPE, and extensive cleaning of exam room while observing appropriate contact time as indicated for disinfecting solutions.  I'm seeing this patient by the request  of:  Roger Small, MD  CC: Back pain follow-up  [id number]  Roger [name] is a 60 y.o. male coming in with complaint of back pain. OMT 05/14/2020. Patient states that he has been doing well since last visit.  Patient states some mild tightness here there but nothing severe.  Patient knows he can do a little better with some of the activities.  Medications patient has been prescribed: None  Taking:         Reviewed prior external information including notes and imaging from previsou exam, outside providers and external EMR if available.   As well as notes that were available from care everywhere and other healthcare systems.  Past medical history, social, surgical and family history all reviewed in electronic medical record.  [name] pertanent information unless stated regarding to the chief complaint.   Past Medical History:  Diagnosis Date  . ADD (attention deficit disorder)   . Allergy   . Anxiety   . Arthritis   . Bipolar disorder ([place])   . Depression    DENIES, HAS ADD  . GERD (gastroesophageal reflux disease)    GASTRITIS,  . Hypertension   . 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 Kidney stones   . Pain    CHRONIC BACK  . Sleep apnea    CPAP  . Stroke [facility])    hemorrhagic    [name] Known Allergies   Review of Systems:  [name] headache, visual changes, nausea, vomiting, diarrhea, constipation, dizziness, abdominal pain, skin rash, fevers,  chills, night sweats, weight loss, swollen lymph nodes,  joint swelling, chest pain, shortness of breath, mood changes. POSITIVE muscle aches, body aches  Objective  Blood pressure 110/88, pulse 81, height 5\' 11"  (1.803 m), weight 221 lb (100.2 kg), SpO2 97 %.   General: [name] apparent distress alert and oriented x3 mood and affect normal, dressed appropriately.  HEENT: Pupils equal, extraocular movements intact  Respiratory: Patient's speak in full sentences and does not appear short of breath  Cardiovascular: [name] lower extremity edema, non tender, [name] erythema  Gait normal with good balance and coordination.  MSK:  Non tender with full range of motion and good stability and symmetric strength and tone of shoulders, elbows, wrist, hip, knee and ankles bilaterally.  Back -low back exam has poor core strength.  Overweight he continued tightness of the hip flexor right greater than left.  Mild tightness with FABER test bilaterally as well.  Patient is diffusely tender in the lumbar spine and paraspinal musculature.  Osteopathic findings  T7 extended rotated and side bent left L1 flexed rotated and side bent right L5 flexed rotated and side bent left Sacrum right on right       Assessment and Plan:  Hip flexor tightness Has been 72-month since we have seen patient.  He continues to be relatively active.  Patient is doing the home exercises intermittently.  Trying to be somewhat active but not completely.  Discussed posture and ergonomics, increasing activity slowly.  Follow-up again in 3 to 4 months otherwise.    Nonallopathic problems  Decision today to treat with OMT was based on Physical Exam  After verbal consent patient was treated with HVLA, ME, FPR techniques in  thoracic, lumbar, and sacral  areas  Patient tolerated the procedure well with improvement in symptoms  Patient given exercises, stretches and lifestyle modifications  See medications in patient instructions if  given  Patient will follow up in 8 weeks      The above documentation has been reviewed and is accurate and complete Roger Pulley, DO       Note: This dictation was prepared with Dragon dictation along with smaller phrase technology. Any transcriptional errors that result from this process are unintentional.

## 2020-09-21 ENCOUNTER — Encounter: Payer: Self-pay | Admitting: Family Medicine

## 2020-09-21 ENCOUNTER — Other Ambulatory Visit: Payer: Self-pay

## 2020-09-21 ENCOUNTER — Ambulatory Visit (INDEPENDENT_AMBULATORY_CARE_PROVIDER_SITE_OTHER): Payer: 59 | Admitting: Family Medicine

## 2020-09-21 VITALS — BP 110/88 | HR 81 | Ht 71.0 in | Wt 221.0 lb

## 2020-09-21 DIAGNOSIS — M24551 Contracture, right hip: Secondary | ICD-10-CM | POA: Diagnosis not present

## 2020-09-21 DIAGNOSIS — M9903 Segmental and somatic dysfunction of lumbar region: Secondary | ICD-10-CM

## 2020-09-21 DIAGNOSIS — M9902 Segmental and somatic dysfunction of thoracic region: Secondary | ICD-10-CM

## 2020-09-21 DIAGNOSIS — M9904 Segmental and somatic dysfunction of sacral region: Secondary | ICD-10-CM | POA: Diagnosis not present

## 2020-09-21 NOTE — Assessment & Plan Note (Signed)
Has been 8-month since we have seen patient.  He continues to be relatively active.  Patient is doing the home exercises intermittently.  Trying to be somewhat active but not completely.  Discussed posture and ergonomics, increasing activity slowly.  Follow-up again in 3 to 4 months otherwise.

## 2020-09-21 NOTE — Patient Instructions (Signed)
Good to see you Overall nothing terrible Let's see you again in 2-3 months

## 2020-09-24 ENCOUNTER — Ambulatory Visit: Payer: 59 | Admitting: Family Medicine

## 2020-12-07 NOTE — Progress Notes (Signed)
[place] Phone: [phone] Subjective:   Roger Roger [name], am serving as a scribe for Dr. Hulan Saas.  This visit occurred during the SARS-CoV-2 public health emergency.  Safety protocols were in place, including screening questions prior to the visit, additional usage of staff PPE, and extensive cleaning of exam room while observing appropriate contact time as indicated for disinfecting solutions.    I'm seeing this patient by the request  of:  [name], MD  CC: Neck and back pain follow-up  [id number]  Roger Roger [name] is a 60 y.o. male coming in with complaint of back and neck pain. OMT 09/21/2020. Patient states that he has been having R sided SI joint pain. Pain with sleeping on that side.   Medications patient has been prescribed: None         Past Medical History:  Diagnosis Date   ADD (attention deficit disorder)    Allergy    Anxiety    Arthritis    Bipolar disorder (HCC)    Depression    DENIES, HAS ADD   GERD (gastroesophageal reflux disease)    GASTRITIS,   Hypertension    Intracerebral hemorrhage (HCC)    Right frontal lobe   Kidney stones    Pain    CHRONIC BACK   Sleep apnea    CPAP   Stroke (HCC)    hemorrhagic    Roger [name] Known Allergies   Review of Systems:  Roger [name] headache, visual changes, nausea, vomiting, diarrhea, constipation, dizziness, abdominal pain, skin rash, fevers, chills, night sweats, weight loss, swollen lymph nodes, body aches, joint swelling, chest pain, shortness of breath, mood changes. POSITIVE muscle aches  Objective  Blood pressure 128/80, pulse 67, height '5\' 11"'$  (1.803 m), weight 224 lb (101.6 kg), SpO2 97 %.   General: Roger [name] apparent distress alert and oriented x3 mood and affect normal, dressed appropriately.  HEENT: Pupils equal, extraocular movements intact  Respiratory: Patient's speak in full sentences and does not appear short of breath   Cardiovascular: Roger [name] lower extremity edema, non tender, Roger [name] erythema  Neck pain shows some mild loss of doses.  Some tenderness to palpation of the paraspinal musculature.  Does have some difficulty with sidebending right greater than left. Low back exam does have significant loss of lordosis.  Some significant tightness of the hip flexor right greater than left.  Difficulty to do [name] secondary to body habitus.  Poor core strength  Osteopathic findings   T8 extended rotated and side bent left L2 flexed rotated and side bent right Sacrum right on right       Assessment and Plan:   Hip flexor tightness Continued significant tightness noted.  Discussed posture and ergonomics, discussed which activities to doing which wants to avoid.  Increase activity slowly.  Encourage core strengthening.  Patient is going to work on weight loss.  Follow-up with me again in 6 weeks.  Nonallopathic problems  Decision today to treat with OMT was based on Physical Exam  After verbal consent patient was treated with HVLA, ME, FPR techniques in   thoracic, lumbar, and sacral  areas  Patient tolerated the procedure well with improvement in symptoms  Patient given exercises, stretches and lifestyle modifications  See medications in patient instructions if given  Patient will follow up in 4-8 weeks      The above documentation has been reviewed and is accurate and complete Lyndal Pulley, DO  Note: This dictation was prepared with Dragon dictation along with smaller phrase technology. Any transcriptional errors that result from this process are unintentional.

## 2020-12-10 ENCOUNTER — Ambulatory Visit (INDEPENDENT_AMBULATORY_CARE_PROVIDER_SITE_OTHER): Payer: 59 | Admitting: Family Medicine

## 2020-12-10 ENCOUNTER — Encounter: Payer: Self-pay | Admitting: Family Medicine

## 2020-12-10 ENCOUNTER — Other Ambulatory Visit: Payer: Self-pay

## 2020-12-10 VITALS — BP 128/80 | HR 67 | Ht 71.0 in | Wt 224.0 lb

## 2020-12-10 DIAGNOSIS — M9903 Segmental and somatic dysfunction of lumbar region: Secondary | ICD-10-CM

## 2020-12-10 DIAGNOSIS — M9904 Segmental and somatic dysfunction of sacral region: Secondary | ICD-10-CM | POA: Diagnosis not present

## 2020-12-10 DIAGNOSIS — M9902 Segmental and somatic dysfunction of thoracic region: Secondary | ICD-10-CM

## 2020-12-10 DIAGNOSIS — M24551 Contracture, right hip: Secondary | ICD-10-CM | POA: Diagnosis not present

## 2020-12-10 NOTE — Assessment & Plan Note (Signed)
Continued significant tightness noted.  Discussed posture and ergonomics, discussed which activities to doing which wants to avoid.  Increase activity slowly.  Encourage core strengthening.  Patient is going to work on weight loss.  Follow-up with me again in 6 weeks.

## 2020-12-10 NOTE — Patient Instructions (Signed)
Can't wait to hear about travels See me in 2-3 months

## 2021-06-05 NOTE — Progress Notes (Deleted)
[place] Phone: [phone] Subjective:    I'm seeing this patient by the request  of:  [name], MD  CC:   [id number]  Roger [name] is a 61 y.o. male coming in with complaint of back and neck pain. OMT 12/10/2020. Patient states   Medications patient has been prescribed: None  Taking:         Reviewed prior external information including notes and imaging from previsou exam, outside providers and external EMR if available.   As well as notes that were available from care everywhere and other healthcare systems.  Past medical history, social, surgical and family history all reviewed in electronic medical record.  No pertanent information unless stated regarding to the chief complaint.   Past Medical History:  Diagnosis Date   ADD (attention deficit disorder)    Allergy    Anxiety    Arthritis    Bipolar disorder (HCC)    Depression    DENIES, HAS ADD   GERD (gastroesophageal reflux disease)    GASTRITIS,   Hypertension    Intracerebral hemorrhage (HCC)    Right frontal lobe   Kidney stones    Pain    CHRONIC BACK   Sleep apnea    CPAP   Stroke ([place])    hemorrhagic    No Known Allergies   Review of Systems:  No headache, visual changes, nausea, vomiting, diarrhea, constipation, dizziness, abdominal pain, skin rash, fevers, chills, night sweats, weight loss, swollen lymph nodes, body aches, joint swelling, chest pain, shortness of breath, mood changes. POSITIVE muscle aches  Objective  There were no vitals taken for this visit.   General: No apparent distress alert and oriented x3 mood and affect normal, dressed appropriately.  HEENT: Pupils equal, extraocular movements intact  Respiratory: Patient's speak in full sentences and does not appear short of breath  Cardiovascular: No lower extremity edema, non tender, no erythema  Neuro: Cranial nerves II through XII are intact,  neurovascularly intact in all extremities with 2+ DTRs and 2+ pulses.  Gait normal with good balance and coordination.  MSK:  Non tender with full range of motion and good stability and symmetric strength and tone of shoulders, elbows, wrist, hip, knee and ankles bilaterally.  Back - Normal skin, Spine with normal alignment and no deformity.  No tenderness to vertebral process palpation.  Paraspinous muscles are not tender and without spasm.   Range of motion is full at neck and lumbar sacral regions  Osteopathic findings  C2 flexed rotated and side bent right C6 flexed rotated and side bent left T3 extended rotated and side bent right inhaled rib T9 extended rotated and side bent left L2 flexed rotated and side bent right Sacrum right on right       Assessment and Plan:    Nonallopathic problems  Decision today to treat with OMT was based on Physical Exam  After verbal consent patient was treated with HVLA, ME, FPR techniques in cervical, rib, thoracic, lumbar, and sacral  areas  Patient tolerated the procedure well with improvement in symptoms  Patient given exercises, stretches and lifestyle modifications  See medications in patient instructions if given  Patient will follow up in 4-8 weeks      The above documentation has been reviewed and is accurate and complete Jacqualin Combes       Note: This dictation was prepared with Dragon dictation along with smaller phrase technology. Any  transcriptional errors that result from this process are unintentional.

## 2021-06-06 ENCOUNTER — Ambulatory Visit: Payer: Self-pay | Admitting: Family Medicine

## 2021-06-10 ENCOUNTER — Ambulatory Visit: Payer: 59 | Admitting: Family Medicine

## 2021-07-08 DIAGNOSIS — Z0000 Encounter for general adult medical examination without abnormal findings: Secondary | ICD-10-CM | POA: Diagnosis not present

## 2021-07-08 DIAGNOSIS — Z23 Encounter for immunization: Secondary | ICD-10-CM | POA: Diagnosis not present

## 2021-07-08 DIAGNOSIS — E538 Deficiency of other specified B group vitamins: Secondary | ICD-10-CM | POA: Diagnosis not present

## 2021-07-08 DIAGNOSIS — R7301 Impaired fasting glucose: Secondary | ICD-10-CM | POA: Diagnosis not present

## 2021-07-08 DIAGNOSIS — Z13220 Encounter for screening for lipoid disorders: Secondary | ICD-10-CM | POA: Diagnosis not present

## 2021-07-08 DIAGNOSIS — I10 Essential (primary) hypertension: Secondary | ICD-10-CM | POA: Diagnosis not present

## 2021-07-29 ENCOUNTER — Ambulatory Visit (HOSPITAL_BASED_OUTPATIENT_CLINIC_OR_DEPARTMENT_OTHER): Payer: Self-pay | Admitting: Cardiology

## 2021-08-08 NOTE — Progress Notes (Signed)
?Charlann Boxer D.O. ?[facility] ?[place] ?Phone: [phone] ?Subjective:   ?I, Jacqualin Combes, am serving as a scribe for Dr. Hulan Saas. ? ?This visit occurred during the SARS-CoV-2 public health emergency.  Safety protocols were in place, including screening questions prior to the visit, additional usage of staff PPE, and extensive cleaning of exam room while observing appropriate contact time as indicated for disinfecting solutions.  ?I'm seeing this patient by the request  of:  [name], MD ? ?CC: Neck and low back pain follow-up ? ?[id number]  ?Roger [name] is a 61 y.o. male coming in with complaint of back and neck pain. OMT August 2022. Patient states that has been doing relatively well.  No significant changes in his health recently.  Has had some labs showing B12 deficiency that was reviewed by me today.  Continuing to see a regular provider to help with blood pressure.  Denies though any type of headaches. ? ?Medications patient has been prescribed: None ? ? ?  ? ? ? ? ?Reviewed prior external information including notes and imaging from previsou exam, outside providers and external EMR if available.  ? ?As well as notes that were available from care everywhere and other healthcare systems. ? ?Past medical history, social, surgical and family history all reviewed in electronic medical record.  No pertanent information unless stated regarding to the chief complaint.  ? ?Past Medical History:  ?Diagnosis Date  ? ADD (attention deficit disorder)   ? Allergy   ? Anxiety   ? Arthritis   ? Bipolar disorder ([place])   ? Depression   ? DENIES, HAS ADD  ? GERD (gastroesophageal reflux disease)   ? GASTRITIS,  ? Hypertension   ? 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 Kidney stones   ? Pain   ? CHRONIC BACK  ? Sleep apnea   ? CPAP  ? Stroke [facility])   ? hemorrhagic  ?  ?No Known Allergies ? ? ?Review of Systems: ? No headache, visual changes, nausea,  vomiting, diarrhea, constipation, dizziness, abdominal pain, skin rash, fevers, chills, night sweats, weight loss, swollen lymph nodes, joint swelling, chest pain, shortness of breath, mood changes. POSITIVE muscle aches, body aches ? ?Objective  ?Blood pressure 120/84, pulse 83, height '5\' 11"'$  (1.803 m), weight 226 lb (102.5 kg), SpO2 96 %. ?  ?General: No apparent distress alert and oriented x3 mood and affect normal, dressed appropriately.  ?HEENT: Pupils equal, extraocular movements intact  ?Respiratory: Patient's speak in full sentences and does not appear short of breath  ?Cardiovascular: No lower extremity edema, non tender, no erythema  ?Neck exam does have some loss of lordosis.  Some tenderness to palpation in the paraspinal musculature.  Low back exam does have some loss of lordosis as well.  Tightness of the hip flexors bilaterally right greater than left. ? ? ?Osteopathic findings ? ?C3 flexed rotated and side bent right ?C7 flexed rotated and side bent right ?T3 extended rotated and side bent right inhaled rib ?T6 extended rotated and side bent left inhaled. ?L4 flexed rotated and side bent left ?Sacrum right on right ? ? ? ? ?  ?Assessment and Plan: ?Hip flexor tightness ?Continue tightness of the hip flexors that does cause some back pain.  Overall doing relatively well.  We discussed with patient icing regimen and home exercises.  Patient seems to be doing all right but still not doing the exercises on a regular  basis.  Discussed with patient to increase activity slowly.  Follow-up again in 6 to 8 weeks.  ? ? ?Nonallopathic problems ? ?Decision today to treat with OMT was based on Physical Exam ? ?After verbal consent patient was treated with HVLA, ME, FPR techniques in cervical, rib, thoracic, lumbar, and sacral  areas ? ?Patient tolerated the procedure well with improvement in symptoms ? ?Patient given exercises, stretches and lifestyle modifications ? ?See medications in patient instructions if  given ? ?Patient will follow up in 4-8 weeks ? ?  ? ? ?The above documentation has been reviewed and is accurate and complete Lyndal Pulley, DO ? ? ? ?  ? ? Note: This dictation was prepared with Dragon dictation along with smaller phrase technology. Any transcriptional errors that result from this process are unintentional.    ?  ?  ? ?

## 2021-08-12 ENCOUNTER — Ambulatory Visit: Payer: BC Managed Care – PPO | Admitting: Family Medicine

## 2021-08-12 VITALS — BP 120/84 | HR 83 | Ht 71.0 in | Wt 226.0 lb

## 2021-08-12 DIAGNOSIS — M9903 Segmental and somatic dysfunction of lumbar region: Secondary | ICD-10-CM

## 2021-08-12 DIAGNOSIS — M9902 Segmental and somatic dysfunction of thoracic region: Secondary | ICD-10-CM | POA: Diagnosis not present

## 2021-08-12 DIAGNOSIS — M9908 Segmental and somatic dysfunction of rib cage: Secondary | ICD-10-CM

## 2021-08-12 DIAGNOSIS — M24551 Contracture, right hip: Secondary | ICD-10-CM

## 2021-08-12 DIAGNOSIS — M9904 Segmental and somatic dysfunction of sacral region: Secondary | ICD-10-CM

## 2021-08-12 DIAGNOSIS — M9901 Segmental and somatic dysfunction of cervical region: Secondary | ICD-10-CM | POA: Diagnosis not present

## 2021-08-12 NOTE — Assessment & Plan Note (Signed)
Continue tightness of the hip flexors that does cause some back pain.  Overall doing relatively well.  We discussed with patient icing regimen and home exercises.  Patient seems to be doing all right but still not doing the exercises on a regular basis.  Discussed with patient to increase activity slowly.  Follow-up again in 6 to 8 weeks. ?

## 2021-08-12 NOTE — Patient Instructions (Signed)
Good to see you ?Stay active ?See me in 3 months ?

## 2021-09-05 ENCOUNTER — Ambulatory Visit (INDEPENDENT_AMBULATORY_CARE_PROVIDER_SITE_OTHER): Payer: BC Managed Care – PPO | Admitting: Cardiology

## 2021-09-05 ENCOUNTER — Encounter (HOSPITAL_BASED_OUTPATIENT_CLINIC_OR_DEPARTMENT_OTHER): Payer: Self-pay | Admitting: Cardiology

## 2021-09-05 VITALS — BP 118/82 | HR 54 | Ht 71.0 in | Wt 224.4 lb

## 2021-09-05 DIAGNOSIS — I10 Essential (primary) hypertension: Secondary | ICD-10-CM

## 2021-09-05 DIAGNOSIS — R42 Dizziness and giddiness: Secondary | ICD-10-CM | POA: Diagnosis not present

## 2021-09-05 DIAGNOSIS — Z7189 Other specified counseling: Secondary | ICD-10-CM

## 2021-09-05 DIAGNOSIS — Z8673 Personal history of transient ischemic attack (TIA), and cerebral infarction without residual deficits: Secondary | ICD-10-CM | POA: Diagnosis not present

## 2021-09-05 DIAGNOSIS — Z8249 Family history of ischemic heart disease and other diseases of the circulatory system: Secondary | ICD-10-CM | POA: Diagnosis not present

## 2021-09-05 NOTE — Patient Instructions (Signed)
Medication Instructions:  ?Your physician recommends that you continue on your current medications as directed. Please refer to the Current Medication list given to you today.  ? ?Labwork: ?NONE ? ?Testing/Procedures: ?NONE ? ?Follow-Up: ?AS NEEDED  ? ?  ?

## 2021-09-05 NOTE — Progress Notes (Signed)
?Cardiology Office Note:   ? ?Date:  09/05/2021  ? ?ID:  Roger [name], DOB 10/26/1960, MRN [id number] ? ?PCP:  Eunice Blase, MD  ?Cardiologist:  Buford Dresser, MD ? ?Referring MD: [name], MD  ? ?CC: Follow-up ? ?History of Present Illness:   ? ?Roger [name] is a 61 y.o. male with a hx of CVA (intraparenchymal right frontal lobe punctate hemorrhage with surrounding subarachnoid hemorrhage), hypertension, family history of heart diseaes who is seen for follow-up. He was initially seen 06/24/2019 as a new consult at the request of [name], MD for the evaluation and management of presyncope. ? ?Cardiovascular risk factors: ?Prior clinical ASCVD: no MI. 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 Denies hyperlipidemia, diabetes, chronic kidney disease ?Metabolic syndrome/Obesity: BMI 30 ?Chronic inflammatory conditions: none ?Tobacco use history: former smoker, smoked through ages [age]. ?Family history: doesn't have details from his father or brother. Father is 57, just had a pacemaker. Paternal uncle in upper 49s had a pacemaker. Younger brother had unclear heart issues as well. ?Prior cardiac testing and/or incidental findings on other testing (ie coronary calcium): echo normal 2014. ?Social: Training and development officer, works as a Probation officer as well. Originally from [place].  ?Diet/exercise: cooks vegetarian, beans, rice. Walks a lot, gets >10,000 steps/day ? ?Today: ?He states he is feeling good from a cardiovascular perspective. Lately he has been trying to increase his exercise. ? ?This past week he had one isolated "dizzy spell." Otherwise he has not had significant symptoms.  ? ?We reviewed his prior calcium score and the recommendations with a score of 0. ? ?He denies any palpitations, chest pain, shortness of breath, or peripheral edema. No headaches, syncope, orthopnea, or PND. ? ? ?Past Medical History:  ?Diagnosis Date  ? ADD (attention deficit disorder)   ? Allergy    ? Anxiety   ? Arthritis   ? Bipolar disorder ([place])   ? Depression   ? DENIES, HAS ADD  ? GERD (gastroesophageal reflux disease)   ? GASTRITIS,  ? Hypertension   ? 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 Kidney stones   ? Pain   ? CHRONIC BACK  ? Sleep apnea   ? CPAP  ? Stroke [facility])   ? hemorrhagic  ? ? ?Past Surgical History:  ?Procedure Laterality Date  ? COLONOSCOPY    ? HERNIA REPAIR    ? Umbilical Hernia  ? INGUINAL HERNIA REPAIR Right 09/25/2015  ? Procedure: HERNIA REPAIR INGUINAL ADULT;  Surgeon: Leonie Green, MD;  Location: [facility] ORS;  Service: General;  Laterality: Right;  ? ? ?Current Medications: ?Current Outpatient Medications on File Prior to Visit  ?Medication Sig  ? ALPRAZolam (XANAX) 1 MG tablet Take 1 mg by mouth 2 (two) times daily as needed for anxiety.   ? amphetamine-dextroamphetamine (ADDERALL) 20 MG tablet Take 20 mg by mouth daily.  ? cetirizine (ZYRTEC) 10 MG tablet Take 10 mg by mouth daily.  ? esomeprazole (NEXIUM) 20 MG capsule Take 20 mg by mouth daily.  ? HYDROcodone-acetaminophen (NORCO) 5-325 MG tablet Take 1-2 tablets by mouth every 4 (four) hours as needed for moderate pain.  ? lisinopril-hydrochlorothiazide (PRINZIDE,ZESTORETIC) 10-12.5 MG tablet Take 1 tablet by mouth daily.  ? pantoprazole (PROTONIX) 40 MG tablet Take 40 mg by mouth daily.  ? traZODone (DESYREL) 100 MG tablet Take 100 mg by mouth at bedtime.  ? ?No current facility-administered medications on file prior to visit.  ?  ? ?Allergies:  Patient has no known allergies.  ? ?Social History  ? ?Tobacco Use  ? Smoking status: Former  ?  Packs/day: 0.50  ?  Years: 10.00  ?  Pack years: 5.00  ?  Types: Cigarettes  ?  Quit date: 09/16/2013  ?  Years since quitting: 7.9  ? Smokeless tobacco: Never  ?Substance Use Topics  ? Alcohol use: Yes  ?  Comment: One drink a night  ? Drug use: Yes  ?  Types: Marijuana  ? ? ?Family History: ?family history includes Diabetes in his mother; Hypertension in his mother;  Obesity in his mother; Pancreatic cancer in his mother. ? ?ROS:   ?Please see the history of present illness. ?(+) Dizziness ?All other systems are reviewed and negative.  ? ? ?EKGs/Labs/Other Studies Reviewed:   ? ?The following studies were reviewed today: ? ?Coronary Calcium Score 07/11/2019: ?FINDINGS: ?Non-cardiac: See separate report from [facility]. ?  ?Ascending aorta: No atherosclerosis diameter 3.8 cm ?  ?Pericardium: Normal ?  ?Coronary arteries: No calcium noted ?  ?IMPRESSION: ?Coronary calcium score of 0. ?  ?Ascending aortic root diameter 3.8 cm ? ?Carotid Doppler 11/01/2012: ?Summary:  ? ?- The vertebral arteries appear patent with antegrade flow.  ?- Findings consistent with less than 39 percent stenosis  ?  involving the right internal carotid artery and the left  ?  internal carotid artery.  ? ? ?EKG:  EKG is personally reviewed.   ?09/05/2021: sinus bradycardia at 54 bpm, IVCD ?06/24/2019: NSR, incomplete RBBB ? ?Recent Labs: ?No results found for requested labs within last 8760 hours.  ? ?Recent Lipid Panel ?   ?Component Value Date/Time  ? CHOL 178 05/15/2014 0951  ? TRIG 214.0 (H) 05/15/2014 0951  ? HDL 26.60 (L) 05/15/2014 0951  ? CHOLHDL 7 05/15/2014 0951  ? VLDL 42.8 (H) 05/15/2014 0951  ? LDLDIRECT 92.2 05/15/2014 0951  ? ? ?Physical Exam:   ? ?VS:  BP 118/82 (BP Location: Left Arm, Patient Position: Sitting, Cuff Size: Large)   Pulse (!) 54   Ht '5\' 11"'$  (1.803 m)   Wt 224 lb 6.4 oz (101.8 kg)   BMI 31.30 kg/m?    ?No data found. ? ? ?Wt Readings from Last 3 Encounters:  ?09/05/21 224 lb 6.4 oz (101.8 kg)  ?08/12/21 226 lb (102.5 kg)  ?12/10/20 224 lb (101.6 kg)  ?  ?GEN: Well nourished, well developed in no acute distress ?HEENT: Normal, moist mucous membranes ?NECK: No JVD ?CARDIAC: regular rhythm, normal S1 and S2, no rubs or gallops. No murmurs. ?VASCULAR: Radial and DP pulses 2+ bilaterally. No carotid bruits ?RESPIRATORY:  Clear to auscultation without rales, wheezing or  rhonchi  ?ABDOMEN: Soft, non-tender, non-distended ?MUSCULOSKELETAL:  Ambulates independently ?SKIN: Warm and dry, no edema ?NEUROLOGIC:  Alert and oriented x 3. No focal neuro deficits noted. ?PSYCHIATRIC:  Normal affect   ? ?ASSESSMENT:   ? ?1. History of CVA (cerebrovascular accident)   ?2. Essential hypertension   ?3. Episodic lightheadedness   ?4. Family history of heart disease   ?5. Cardiac risk counseling   ? ? ?PLAN:   ? ?Presyncope: exceptionally rare, no full syncope. .  ?-ECG without high grade conduction disease ?-normal exam ?-prior echo, carotid dopplers normal ?-If becomes frequent or he has full syncope, he will call and we will see him back to discuss next steps. ? ?History of prior CVA: hemorrhagic, not ischemic per my review of 2014 records ? ?Hypertension: well controlled today ?-continue lisinopril-HCTZ ? ?Family  history of heart disease: ?-calcium score 0, low 15 year risk of cardiovascular mortality. ? ?Cardiac risk counseling and prevention recommendations: ?-recommend heart healthy/Mediterranean diet, with whole grains, fruits, vegetable, fish, lean meats, nuts, and olive oil. Limit salt. ?-recommend moderate walking, 3-5 times/week for 30-50 minutes each session. Aim for at least 150 minutes.week. Goal should be pace of 3 miles/hours, or walking 1.5 miles in 30 minutes ?-recommend avoidance of tobacco products. Avoid excess alcohol. ? ?Plan for follow up: PRN. ? ?Buford Dresser, MD, PhD, [facility] ?[place]   ? ?Medication Adjustments/Labs and Tests Ordered: ?Current medicines are reviewed at length with the patient today.  Concerns regarding medicines are outlined above.  ? ?Orders Placed This Encounter  ?Procedures  ? EKG 12-Lead  ? ?No orders of the defined types were placed in this encounter. ? ?Patient Instructions  ?Medication Instructions:  ?Your physician recommends that you continue on your current medications as directed. Please refer to the Current  Medication list given to you today. ? ?Labwork: ?NONE ? ?Testing/Procedures: ?NONE ? ?Follow-Up: ?AS NEEDED  ?  ? ?I,Mathew Stumpf,acting as a scribe for [employer], MD.,have documented all relevant docume

## 2021-10-25 DIAGNOSIS — G4733 Obstructive sleep apnea (adult) (pediatric): Secondary | ICD-10-CM | POA: Diagnosis not present

## 2021-10-25 DIAGNOSIS — R0683 Snoring: Secondary | ICD-10-CM | POA: Diagnosis not present

## 2021-10-26 DIAGNOSIS — R0683 Snoring: Secondary | ICD-10-CM | POA: Diagnosis not present

## 2021-10-26 DIAGNOSIS — G4733 Obstructive sleep apnea (adult) (pediatric): Secondary | ICD-10-CM | POA: Diagnosis not present

## 2021-10-28 DIAGNOSIS — F411 Generalized anxiety disorder: Secondary | ICD-10-CM | POA: Diagnosis not present

## 2021-10-28 DIAGNOSIS — F5101 Primary insomnia: Secondary | ICD-10-CM | POA: Diagnosis not present

## 2021-10-28 DIAGNOSIS — F900 Attention-deficit hyperactivity disorder, predominantly inattentive type: Secondary | ICD-10-CM | POA: Diagnosis not present

## 2021-11-11 DIAGNOSIS — L821 Other seborrheic keratosis: Secondary | ICD-10-CM | POA: Diagnosis not present

## 2021-11-11 DIAGNOSIS — D1801 Hemangioma of skin and subcutaneous tissue: Secondary | ICD-10-CM | POA: Diagnosis not present

## 2021-11-11 DIAGNOSIS — C44719 Basal cell carcinoma of skin of left lower limb, including hip: Secondary | ICD-10-CM | POA: Diagnosis not present

## 2021-11-11 DIAGNOSIS — D2272 Melanocytic nevi of left lower limb, including hip: Secondary | ICD-10-CM | POA: Diagnosis not present

## 2021-11-11 DIAGNOSIS — D2271 Melanocytic nevi of right lower limb, including hip: Secondary | ICD-10-CM | POA: Diagnosis not present

## 2021-11-11 DIAGNOSIS — D225 Melanocytic nevi of trunk: Secondary | ICD-10-CM | POA: Diagnosis not present

## 2021-11-11 DIAGNOSIS — C44519 Basal cell carcinoma of skin of other part of trunk: Secondary | ICD-10-CM | POA: Diagnosis not present

## 2021-11-11 DIAGNOSIS — L570 Actinic keratosis: Secondary | ICD-10-CM | POA: Diagnosis not present

## 2021-11-11 DIAGNOSIS — C44619 Basal cell carcinoma of skin of left upper limb, including shoulder: Secondary | ICD-10-CM | POA: Diagnosis not present

## 2021-11-18 ENCOUNTER — Ambulatory Visit: Payer: BC Managed Care – PPO | Admitting: Family Medicine

## 2022-01-10 ENCOUNTER — Telehealth: Payer: Self-pay | Admitting: Gastroenterology

## 2022-01-10 NOTE — Telephone Encounter (Signed)
Good Morning Dr Loletha Carrow,  Supervising MD 6/13 PM.  We have received a referral from patient primary care provider for patient to be seen for a Colonoscopy.  Patient was last seen with [facility] in 2015 and also [facility] in 2017. I am sending records for review. Please review and advise on scheduling.  Thank you

## 2022-01-12 NOTE — Telephone Encounter (Signed)
I have reviewed this patient's chart which was on my desk.  He has a history of previous colonoscopy and endoscopy in 2015  EGD LA grade C erosive esophagitis with irregular Z-line. Gastritis. Duodenitis.  Colonoscopy Diverticulosis in the sigmoid colon and descending colon. One 4 mm polyp in the cecum.  Resected and retrieved. Three 1 to 2 mm polyps in the cecum.  Resected and retrieved. 1 2 mm polyp in the mid sigmoid colon.  Resected and retrieved. 2 2 to 3 mm polyps at the rectosigmoid colon.  Resected and retrieved.  Pathology Stomach biopsies Negative for H. pylori and dysplasia  GE junction biopsies Squamocolumnar mucosa with mild reactive changes negative for Barrett's  Esophagus at 37 cm biopsies Squamous mucosa with reactive epithelial changes  Cecum polyp Tubular adenoma  Cecum polyps Tubular adenoma/hyperplastic polyp  Sigmoid colon polyp Hyperplastic  Sigmoid and rectosigmoid polyps Hyperplastic  These results will be scanned into the chart.  Here is the plan of action for the patient: 1) colonoscopy for surveillance makes sense and is overdue so he can be scheduled for a surveillance colonoscopy 2) EGD for surveillance and ensuring that his previous grade C esophagitis is healed is a reasonable test as well.  He can be offered an EGD at the same time as surveillance colonoscopy.  If he defers on upper endoscopy then that is okay as well since no Barrett's was found at the time but we always want to ensure that esophagitis heals because it can be a precursor to Barrett's 3) I do not see any other contraindication why this patient cannot have his procedures in the [place] so they can be scheduled next available with me and that works with his schedule.   Thanks. GM

## 2022-01-13 NOTE — Telephone Encounter (Signed)
See the message per Dr Rush Landmark. Will need EGD colon in the [place] with previsit.

## 2022-01-24 ENCOUNTER — Encounter: Payer: Self-pay | Admitting: Gastroenterology

## 2022-01-24 ENCOUNTER — Telehealth: Payer: Self-pay

## 2022-01-24 NOTE — Telephone Encounter (Signed)
-----   Message from Timothy Lasso, RN sent at 01/20/2022  8:40 AM EDT -----  ----- Message ----- From: Timothy Lasso, RN Sent: 01/20/2022  12:00 AM EDT To: Timothy Lasso, RN  Make sure pt has endo colon and previsit.

## 2022-01-24 NOTE — Telephone Encounter (Signed)
PV on Monday, 02/10/22 at 1 pm ECL in the [place] on 03/07/22 at 9 am.

## 2022-02-10 ENCOUNTER — Ambulatory Visit (AMBULATORY_SURGERY_CENTER): Payer: Self-pay

## 2022-02-10 VITALS — Ht 71.0 in | Wt 219.0 lb

## 2022-02-10 DIAGNOSIS — K209 Esophagitis, unspecified without bleeding: Secondary | ICD-10-CM

## 2022-02-10 DIAGNOSIS — Z86010 Personal history of colonic polyps: Secondary | ICD-10-CM

## 2022-02-10 NOTE — Progress Notes (Signed)
No egg or soy allergy known to patient  No issues known to pt with past sedation with any surgeries or procedures Patient denies ever being told they had issues or difficulty with intubation  No FH of Malignant Hyperthermia Pt is not on diet pills Pt is not on  home 02  Pt is not on blood thinners  Pt denies issues with constipation  No A fib or A flutter Have any cardiac testing pending--denied Pt instructed to use Singlecare.com or GoodRx for a price reduction on prep   

## 2022-02-27 ENCOUNTER — Encounter: Payer: Self-pay | Admitting: Gastroenterology

## 2022-02-27 ENCOUNTER — Encounter: Payer: Self-pay | Admitting: Certified Registered Nurse Anesthetist

## 2022-03-07 ENCOUNTER — Encounter: Payer: Self-pay | Admitting: Gastroenterology

## 2022-03-07 ENCOUNTER — Ambulatory Visit (AMBULATORY_SURGERY_CENTER): Payer: BC Managed Care – PPO | Admitting: Gastroenterology

## 2022-03-07 VITALS — BP 147/92 | HR 54 | Temp 98.1°F | Resp 20 | Ht 71.0 in | Wt 219.0 lb

## 2022-03-07 DIAGNOSIS — K3189 Other diseases of stomach and duodenum: Secondary | ICD-10-CM | POA: Diagnosis not present

## 2022-03-07 DIAGNOSIS — K219 Gastro-esophageal reflux disease without esophagitis: Secondary | ICD-10-CM | POA: Diagnosis not present

## 2022-03-07 DIAGNOSIS — K269 Duodenal ulcer, unspecified as acute or chronic, without hemorrhage or perforation: Secondary | ICD-10-CM | POA: Diagnosis not present

## 2022-03-07 DIAGNOSIS — K209 Esophagitis, unspecified without bleeding: Secondary | ICD-10-CM

## 2022-03-07 DIAGNOSIS — K2270 Barrett's esophagus without dysplasia: Secondary | ICD-10-CM | POA: Diagnosis not present

## 2022-03-07 DIAGNOSIS — Z86010 Personal history of colonic polyps: Secondary | ICD-10-CM | POA: Diagnosis not present

## 2022-03-07 DIAGNOSIS — D123 Benign neoplasm of transverse colon: Secondary | ICD-10-CM

## 2022-03-07 DIAGNOSIS — D120 Benign neoplasm of cecum: Secondary | ICD-10-CM

## 2022-03-07 DIAGNOSIS — Z09 Encounter for follow-up examination after completed treatment for conditions other than malignant neoplasm: Secondary | ICD-10-CM | POA: Diagnosis not present

## 2022-03-07 DIAGNOSIS — K2980 Duodenitis without bleeding: Secondary | ICD-10-CM | POA: Diagnosis not present

## 2022-03-07 DIAGNOSIS — Z1211 Encounter for screening for malignant neoplasm of colon: Secondary | ICD-10-CM | POA: Diagnosis not present

## 2022-03-07 DIAGNOSIS — D122 Benign neoplasm of ascending colon: Secondary | ICD-10-CM

## 2022-03-07 MED ORDER — ESOMEPRAZOLE MAGNESIUM 40 MG PO CPDR: 40.0000 mg | DELAYED_RELEASE_CAPSULE | Freq: Two times a day (BID) | ORAL | 3 refills | Status: AC

## 2022-03-07 MED ORDER — SODIUM CHLORIDE 0.9 % IV SOLN: 500.0000 mL | Freq: Once | INTRAVENOUS | Status: DC

## 2022-03-07 NOTE — Progress Notes (Signed)
0910 Robinul 0.1 mg IV given due large amount of secretions upon assessment.  MD made aware, vss

## 2022-03-07 NOTE — Progress Notes (Signed)
GASTROENTEROLOGY PROCEDURE H&P NOTE   Primary Care Physician: Eunice Blase, MD  HPI: Roger [name] is a 61 y.o. male who presents for EGD/Colonoscopy for follow up of GERD with prior esophagitis and Colon polyp surveillance.  Past Medical History:  Diagnosis Date   ADD (attention deficit disorder)    Allergy    Anxiety    Arthritis    Bipolar disorder ([place])    Depression    DENIES, HAS ADD   GERD (gastroesophageal reflux disease)    GASTRITIS,   Hypertension    Intracerebral hemorrhage ([facility])    Right frontal lobe   Kidney stones    Pain    CHRONIC BACK   Sleep apnea    CPAP   Stroke [facility])    hemorrhagic   Past Surgical History:  Procedure Laterality Date   COLONOSCOPY     HERNIA REPAIR     Umbilical Hernia   INGUINAL HERNIA REPAIR Right 09/25/2015   Procedure: HERNIA REPAIR INGUINAL ADULT;  Surgeon: Leonie Green, MD;  Location: [facility] ORS;  Service: General;  Laterality: Right;   Current Outpatient Medications  Medication Sig Dispense Refill   ALPRAZolam (XANAX) 1 MG tablet Take 1 mg by mouth 2 (two) times daily as needed for anxiety.  (Patient not taking: Reported on 02/10/2022)     amphetamine-dextroamphetamine (ADDERALL) 20 MG tablet Take 20 mg by mouth daily. (Patient not taking: Reported on 02/10/2022)     cetirizine (ZYRTEC) 10 MG tablet Take 10 mg by mouth daily. (Patient not taking: Reported on 02/10/2022)     esomeprazole (NEXIUM) 20 MG capsule Take 20 mg by mouth daily. (Patient not taking: Reported on 02/10/2022)     HYDROcodone-acetaminophen (NORCO) 5-325 MG tablet Take 1-2 tablets by mouth every 4 (four) hours as needed for moderate pain. (Patient not taking: Reported on 02/10/2022) 12 tablet 0   lisinopril-hydrochlorothiazide (PRINZIDE,ZESTORETIC) 10-12.5 MG tablet Take 1 tablet by mouth daily. (Patient not taking: Reported on 02/10/2022)     pantoprazole (PROTONIX) 40 MG tablet Take 40 mg by mouth daily. (Patient not taking: Reported on 02/10/2022)      traZODone (DESYREL) 100 MG tablet Take 100 mg by mouth at bedtime.     No current facility-administered medications for this visit.    Current Outpatient Medications:    ALPRAZolam (XANAX) 1 MG tablet, Take 1 mg by mouth 2 (two) times daily as needed for anxiety.  (Patient not taking: Reported on 02/10/2022), Disp: , Rfl:    amphetamine-dextroamphetamine (ADDERALL) 20 MG tablet, Take 20 mg by mouth daily. (Patient not taking: Reported on 02/10/2022), Disp: , Rfl:    cetirizine (ZYRTEC) 10 MG tablet, Take 10 mg by mouth daily. (Patient not taking: Reported on 02/10/2022), Disp: , Rfl:    esomeprazole (NEXIUM) 20 MG capsule, Take 20 mg by mouth daily. (Patient not taking: Reported on 02/10/2022), Disp: , Rfl:    HYDROcodone-acetaminophen (NORCO) 5-325 MG tablet, Take 1-2 tablets by mouth every 4 (four) hours as needed for moderate pain. (Patient not taking: Reported on 02/10/2022), Disp: 12 tablet, Rfl: 0   lisinopril-hydrochlorothiazide (PRINZIDE,ZESTORETIC) 10-12.5 MG tablet, Take 1 tablet by mouth daily. (Patient not taking: Reported on 02/10/2022), Disp: , Rfl:    pantoprazole (PROTONIX) 40 MG tablet, Take 40 mg by mouth daily. (Patient not taking: Reported on 02/10/2022), Disp: , Rfl:    traZODone (DESYREL) 100 MG tablet, Take 100 mg by mouth at bedtime., Disp: , Rfl:  No Known Allergies Family History  Problem Relation Age  of Onset   Pancreatic cancer Mother    Hypertension Mother    Diabetes Mother    Obesity Mother    Colon cancer Neg Hx    Esophageal cancer Neg Hx    Rectal cancer Neg Hx    Stomach cancer Neg Hx    Social History   Socioeconomic History   Marital status: Significant Other    Spouse name: Not on file   Number of children: 0   Years of education: 39   Highest education level: Not on file  Occupational History   Occupation: Hairdresser  Tobacco Use   Smoking status: Former    Packs/day: 0.50    Years: 10.00    Total pack years: 5.00    Types: Cigarettes    Quit  date: 09/16/2013    Years since quitting: 8.4   Smokeless tobacco: Never  Substance and Sexual Activity   Alcohol use: Yes    Comment: One drink a night   Drug use: Yes    Types: Marijuana   Sexual activity: Yes    Birth control/protection: None  Other Topics Concern   Not on file  Social History Narrative   ** Merged History Encounter **       ** Data from: 11/03/13 Enc Dept: [facility]       ** Data from: 05/15/14 Enc Dept: [facility]   Born in [place], [place] and raised in [place], [place]. Currently resides in a private residence with his partner and roommate. Fun: Work Denies any religious beliefs effecting healthcare.      Social Determinants of Health   Financial Resource Strain: Not on file  Food Insecurity: Not on file  Transportation Needs: Not on file  Physical Activity: Not on file  Stress: Not on file  Social Connections: Not on file  Intimate Partner Violence: Not on file    Physical Exam: There were no vitals filed for this visit. There is no height or weight on file to calculate BMI. GEN: NAD EYE: Sclerae anicteric ENT: MMM CV: Non-tachycardic GI: Soft, NT/ND NEURO:  Alert & Oriented x 3  Lab Results: No results for input(s): "WBC", "HGB", "HCT", "PLT" in the last 72 hours. BMET No results for input(s): "NA", "K", "CL", "CO2", "GLUCOSE", "BUN", "CREATININE", "CALCIUM" in the last 72 hours. LFT No results for input(s): "PROT", "ALBUMIN", "AST", "ALT", "ALKPHOS", "BILITOT", "BILIDIR", "IBILI" in the last 72 hours. PT/INR No results for input(s): "LABPROT", "INR" in the last 72 hours.   Impression / Plan: This is a 61 y.o.male who presents for EGD/Colonoscopy for follow up of GERD with prior esophagitis and Colon polyp surveillance.  The risks and benefits of endoscopic evaluation/treatment were discussed with the patient and/or family; these include but are not limited to the risk of perforation, infection, bleeding, missed lesions, lack of  diagnosis, severe illness requiring hospitalization, as well as anesthesia and sedation related illnesses.  The patient's history has been reviewed, patient examined, no change in status, and deemed stable for procedure.  The patient and/or family is agreeable to proceed.    Justice Britain, MD [facility] Advanced Endoscopy Office # [phone]

## 2022-03-07 NOTE — Progress Notes (Signed)
Schedule not open for repeat Endoscopy in 3-4 months.

## 2022-03-07 NOTE — Progress Notes (Signed)
Report given to PACU, vss 

## 2022-03-07 NOTE — Patient Instructions (Addendum)
Upper-Restart Nexium 40 mg twice daily. Continue twice daily until follow up Endoscopy in 3-4 months to ensure healing of persisting grade c esophagitis. Lower-Follow a high fiber diet, resume previous medications. Awaiting pathology results. Repeat Colonoscopy date to be determined based on pathology results.  YOU HAD AN ENDOSCOPIC PROCEDURE TODAY AT [facility]:   Refer to the procedure report that was given to you for any specific questions about what was found during the examination.  If the procedure report does not answer your questions, please call your gastroenterologist to clarify.  If you requested that your care partner not be given the details of your procedure findings, then the procedure report has been included in a sealed envelope for you to review at your convenience later.  YOU SHOULD EXPECT: Some feelings of bloating in the abdomen. Passage of more gas than usual.  Walking can help get rid of the air that was put into your GI tract during the procedure and reduce the bloating. If you had a lower endoscopy (such as a colonoscopy or flexible sigmoidoscopy) you may notice spotting of blood in your stool or on the toilet paper. If you underwent a bowel prep for your procedure, you may not have a normal bowel movement for a few days.  Please Note:  You might notice some irritation and congestion in your nose or some drainage.  This is from the oxygen used during your procedure.  There is no need for concern and it should clear up in a day or so.  SYMPTOMS TO REPORT IMMEDIATELY:  Following lower endoscopy (colonoscopy or flexible sigmoidoscopy):  Excessive amounts of blood in the stool  Significant tenderness or worsening of abdominal pains  Swelling of the abdomen that is new, acute  Fever of 100F or higher  Following upper endoscopy (EGD)  Vomiting of blood or coffee ground material  New chest pain or pain under the shoulder blades  Painful or persistently  difficult swallowing  New shortness of breath  Fever of 100F or higher  Black, tarry-looking stools  For urgent or emergent issues, a gastroenterologist can be reached at any hour by calling [phone]. Do not use MyChart messaging for urgent concerns.    DIET:  We do recommend a small meal at first, but then you may proceed to your regular diet.  Drink plenty of fluids but you should avoid alcoholic beverages for 24 hours.  ACTIVITY:  You should plan to take it easy for the rest of today and you should NOT DRIVE or use heavy machinery until tomorrow (because of the sedation medicines used during the test).    FOLLOW UP: Our staff will call the number listed on your records the next business day following your procedure.  We will call around 7:15- 8:00 am to check on you and address any questions or concerns that you may have regarding the information given to you following your procedure. If we do not reach you, we will leave a message.     If any biopsies were taken you will be contacted by phone or by letter within the next 1-3 weeks.  Please call us at [phone] if you have not heard about the biopsies in 3 weeks.    SIGNATURES/CONFIDENTIALITY: You and/or your care partner have signed paperwork which will be entered into your electronic medical record.  These signatures attest to the fact that that the information above on your After Visit Summary has been reviewed and is understood.  Full responsibility of the confidentiality of this discharge information lies with you and/or your care-partner.

## 2022-03-07 NOTE — Op Note (Signed)
[place] Patient Name: Roger [name] Procedure Date: 03/07/2022 9:32 AM MRN: [id number] Endoscopist: Justice Britain , MD, [id number] Age: 61 Referring MD:  Date of Birth: 1960/09/23 Gender: Male Account #: [id number] Procedure:                Colonoscopy Indications:              Surveillance: Personal history of adenomatous                            polyps on last colonoscopy > 5 years ago Medicines:                Monitored Anesthesia Care Procedure:                Pre-Anesthesia Assessment:                           - Prior to the procedure, a History and Physical                            was performed, and patient medications and                            allergies were reviewed. The patient's tolerance of                            previous anesthesia was also reviewed. The risks                            and benefits of the procedure and the sedation                            options and risks were discussed with the patient.                            All questions were answered, and informed consent                            was obtained. Prior Anticoagulants: The patient has                            taken no anticoagulant or antiplatelet agents. ASA                            Grade Assessment: III - A patient with severe                            systemic disease. After reviewing the risks and                            benefits, the patient was deemed in satisfactory                            condition to undergo the procedure.  After obtaining informed consent, the colonoscope                            was passed under direct vision. Throughout the                            procedure, the patient's blood pressure, pulse, and                            oxygen saturations were monitored continuously. The                            CF [id number] #[id number] was introduced through the anus                            and advanced to  the the cecum, identified by                            appendiceal orifice and ileocecal valve. The                            colonoscopy was performed without difficulty. The                            patient tolerated the procedure. The quality of the                            bowel preparation was adequate. The ileocecal                            valve, appendiceal orifice, and rectum were                            photographed. Scope In: 9:35:42 AM Scope Out: 9:53:16 AM Scope Withdrawal Time: 0 hours 14 minutes 42 seconds  Total Procedure Duration: 0 hours 17 minutes 34 seconds  Findings:                 The digital rectal exam findings include                            hemorrhoids. Pertinent negatives include no                            palpable rectal lesions.                           A moderate amount of semi-liquid stool was found in                            the entire colon, interfering with visualization.                            Lavage of the area was performed using copious  amounts, resulting in clearance with adequate                            visualization.                           Five sessile polyps were found in the transverse                            colon (2), ascending colon (2), and cecum (1). The                            polyps were 2 to 10 mm in size. These polyps were                            removed with a cold snare. Resection and retrieval                            were complete.                           A few small-mouthed diverticula were found in the                            ascending colon.                           Normal mucosa was found in the entire colon                            otherwise.                           Non-bleeding non-thrombosed external and internal                            hemorrhoids were found during retroflexion, during                            perianal exam and during  digital exam. The                            hemorrhoids were Grade II (internal hemorrhoids                            that prolapse but reduce spontaneously). Complications:            No immediate complications. Estimated Blood Loss:     Estimated blood loss was minimal. Impression:               - Hemorrhoids found on digital rectal exam.                           - Stool in the entire examined colon.                           -  Five, 2 to 10 mm polyps in the transverse colon,                            in the ascending colon and in the cecum, removed                            with a cold snare. Resected and retrieved.                           - Diverticulosis in the ascending colon.                           - Normal mucosa in the entire examined colon                            otherwise.                           - Non-bleeding non-thrombosed external and internal                            hemorrhoids. Recommendation:           - The patient will be observed post-procedure,                            until all discharge criteria are met.                           - Discharge patient to home.                           - Patient has a contact number available for                            emergencies. The signs and symptoms of potential                            delayed complications were discussed with the                            patient. Return to normal activities tomorrow.                            Written discharge instructions were provided to the                            patient.                           - High fiber diet.                           - Use FiberCon 1-2 tablets PO daily.                           -  Continue present medications.                           - Await pathology results.                           - Repeat colonoscopy in 3 years for surveillance.                           - The findings and recommendations were discussed                             with the patient.                           - The findings and recommendations were discussed                            with the patient's family. Justice Britain, MD 03/07/2022 9:58:23 AM

## 2022-03-07 NOTE — Progress Notes (Signed)
Called to room to assist during endoscopic procedure.  Patient ID and intended procedure confirmed with present staff. Received instructions for my participation in the procedure from the performing physician.  

## 2022-03-07 NOTE — Op Note (Signed)
[place] Patient Name: Roger [name] Procedure Date: 03/07/2022 9:13 AM MRN: [id number] Endoscopist: Justice Britain , MD, [id number] Age: 61 Referring MD:  Date of Birth: 11/29/1960 Gender: Male Account #: [id number] Procedure:                Upper GI endoscopy Indications:              Heartburn, Follow-up of esophagitis Medicines:                Monitored Anesthesia Care Procedure:                Pre-Anesthesia Assessment:                           - Prior to the procedure, a History and Physical                            was performed, and patient medications and                            allergies were reviewed. The patient's tolerance of                            previous anesthesia was also reviewed. The risks                            and benefits of the procedure and the sedation                            options and risks were discussed with the patient.                            All questions were answered, and informed consent                            was obtained. Prior Anticoagulants: The patient has                            taken no anticoagulant or antiplatelet agents. ASA                            Grade Assessment: III - A patient with severe                            systemic disease. After reviewing the risks and                            benefits, the patient was deemed in satisfactory                            condition to undergo the procedure.                           After obtaining informed consent, the endoscope was  passed under direct vision. Throughout the                            procedure, the patient's blood pressure, pulse, and                            oxygen saturations were monitored continuously. The                            GIF [id number] #[id number] was introduced through the                            mouth, and advanced to the second part of duodenum.                            The upper GI  endoscopy was accomplished without                            difficulty. The patient tolerated the procedure. Scope In: Scope Out: Findings:                 No gross lesions were noted in the mid esophagus.                           LA Grade C (one or more mucosal breaks continuous                            between tops of 2 or more mucosal folds, less than                            75% circumference) esophagitis with no bleeding was                            found in the distal esophagus (36 to 41 cm from the                            incisors).                           Two tongues of salmon-colored mucosa were present                            from 40 to 41 cm. No other visible abnormalities                            were present. Biopsies were taken with a cold                            forceps for histology.                           The Z-line was irregular and was found 41 cm from  the incisors.                           Patchy moderate inflammation characterized by                            erosions, erythema and granularity was found in the                            entire examined stomach. Biopsies were taken with a                            cold forceps for histology and Helicobacter pylori                            testing.                           Patchy mild inflammation characterized by erosions                            and friability was found in the duodenal bulb, in                            the first portion of the duodenum and in the second                            portion of the duodenum. Biopsies were taken with a                            cold forceps for histology. Complications:            No immediate complications. Estimated Blood Loss:     Estimated blood loss was minimal. Impression:               - No gross lesions in the proximal esophagus and in                            the mid esophagus. LA Grade C  esophagitis with no                            bleeding noted distally. Salmon-colored mucosal                            tongues suspicious for short-segment Barrett's                            esophagus noted in the very distal esophagus-                            biopsied.                           - Z-line irregular, 41 cm from the incisors.                           -  Gastritis. Biopsied.                           - Duodenitis. Biopsied. Recommendation:           - Proceed to scheduled colonoscopy.                           - Await pathology results.                           - Restart PPI (Nexium) 40 mg twice daily.                           - Plan to maintain 40 mg twice daily until                            follow-up endoscopy in approximately 3 to 4 months                            to ensure healing of persisting grade C esophagitis.                           - Continue present medications otherwise.                           - The findings and recommendations were discussed                            with the patient.                           - The findings and recommendations were discussed                            with the patient's family. Justice Britain, MD 03/07/2022 9:33:36 AM

## 2022-03-10 ENCOUNTER — Telehealth: Payer: Self-pay

## 2022-03-10 NOTE — Telephone Encounter (Signed)
Left message on answering machine. 

## 2022-03-12 ENCOUNTER — Encounter: Payer: Self-pay | Admitting: Gastroenterology

## 2022-03-24 DIAGNOSIS — Z85820 Personal history of malignant melanoma of skin: Secondary | ICD-10-CM | POA: Diagnosis not present

## 2022-03-24 DIAGNOSIS — Z85828 Personal history of other malignant neoplasm of skin: Secondary | ICD-10-CM | POA: Diagnosis not present

## 2022-03-24 DIAGNOSIS — L821 Other seborrheic keratosis: Secondary | ICD-10-CM | POA: Diagnosis not present

## 2022-03-24 DIAGNOSIS — D225 Melanocytic nevi of trunk: Secondary | ICD-10-CM | POA: Diagnosis not present

## 2022-03-24 DIAGNOSIS — L570 Actinic keratosis: Secondary | ICD-10-CM | POA: Diagnosis not present

## 2022-04-17 ENCOUNTER — Other Ambulatory Visit: Payer: Self-pay | Admitting: Family Medicine

## 2022-04-17 DIAGNOSIS — Z800 Family history of malignant neoplasm of digestive organs: Secondary | ICD-10-CM

## 2022-04-21 DIAGNOSIS — G4700 Insomnia, unspecified: Secondary | ICD-10-CM | POA: Diagnosis not present

## 2022-04-21 DIAGNOSIS — F410 Panic disorder [episodic paroxysmal anxiety] without agoraphobia: Secondary | ICD-10-CM | POA: Diagnosis not present

## 2022-04-21 DIAGNOSIS — F900 Attention-deficit hyperactivity disorder, predominantly inattentive type: Secondary | ICD-10-CM | POA: Diagnosis not present

## 2022-04-21 DIAGNOSIS — F3342 Major depressive disorder, recurrent, in full remission: Secondary | ICD-10-CM | POA: Diagnosis not present

## 2022-04-21 DIAGNOSIS — F411 Generalized anxiety disorder: Secondary | ICD-10-CM | POA: Diagnosis not present

## 2022-05-07 ENCOUNTER — Ambulatory Visit
Admission: RE | Admit: 2022-05-07 | Discharge: 2022-05-07 | Disposition: A | Discharge status: Home / Self Care | Payer: No Typology Code available for payment source | Source: Ambulatory Visit | Attending: Family Medicine | Admitting: Family Medicine

## 2022-05-07 DIAGNOSIS — Z800 Family history of malignant neoplasm of digestive organs: Secondary | ICD-10-CM

## 2022-05-07 MED ORDER — GADOPICLENOL 0.5 MMOL/ML IV SOLN
10.0000 mL | Freq: Once | INTRAVENOUS | Status: AC | PRN
  Administered 2022-05-07: 10 mL via INTRAVENOUS

## 2022-06-25 ENCOUNTER — Encounter: Payer: Self-pay | Admitting: Gastroenterology

## 2022-07-17 ENCOUNTER — Other Ambulatory Visit: Payer: Self-pay | Admitting: Family Medicine

## 2022-07-17 DIAGNOSIS — Z87891 Personal history of nicotine dependence: Secondary | ICD-10-CM

## 2022-07-21 ENCOUNTER — Other Ambulatory Visit: Payer: Self-pay | Admitting: Family Medicine

## 2022-07-21 DIAGNOSIS — Z87891 Personal history of nicotine dependence: Secondary | ICD-10-CM

## 2022-08-18 ENCOUNTER — Ambulatory Visit
Admission: RE | Admit: 2022-08-18 | Discharge: 2022-08-18 | Disposition: A | Discharge status: Home / Self Care | Payer: BC Managed Care – PPO | Source: Ambulatory Visit | Attending: Family Medicine | Admitting: Family Medicine

## 2022-08-18 DIAGNOSIS — Z87891 Personal history of nicotine dependence: Secondary | ICD-10-CM

## 2022-08-18 DIAGNOSIS — J439 Emphysema, unspecified: Secondary | ICD-10-CM | POA: Diagnosis not present

## 2022-09-22 DIAGNOSIS — C44319 Basal cell carcinoma of skin of other parts of face: Secondary | ICD-10-CM | POA: Diagnosis not present

## 2022-09-22 DIAGNOSIS — C4441 Basal cell carcinoma of skin of scalp and neck: Secondary | ICD-10-CM | POA: Diagnosis not present

## 2022-09-22 DIAGNOSIS — L570 Actinic keratosis: Secondary | ICD-10-CM | POA: Diagnosis not present

## 2022-09-22 DIAGNOSIS — D225 Melanocytic nevi of trunk: Secondary | ICD-10-CM | POA: Diagnosis not present

## 2022-09-22 DIAGNOSIS — Z85820 Personal history of malignant melanoma of skin: Secondary | ICD-10-CM | POA: Diagnosis not present

## 2022-09-22 DIAGNOSIS — Z85828 Personal history of other malignant neoplasm of skin: Secondary | ICD-10-CM | POA: Diagnosis not present

## 2022-09-22 DIAGNOSIS — L821 Other seborrheic keratosis: Secondary | ICD-10-CM | POA: Diagnosis not present

## 2022-10-06 DIAGNOSIS — F5101 Primary insomnia: Secondary | ICD-10-CM | POA: Diagnosis not present

## 2022-10-06 DIAGNOSIS — F411 Generalized anxiety disorder: Secondary | ICD-10-CM | POA: Diagnosis not present

## 2022-10-06 DIAGNOSIS — F900 Attention-deficit hyperactivity disorder, predominantly inattentive type: Secondary | ICD-10-CM | POA: Diagnosis not present

## 2023-03-30 DIAGNOSIS — D2262 Melanocytic nevi of left upper limb, including shoulder: Secondary | ICD-10-CM | POA: Diagnosis not present

## 2023-03-30 DIAGNOSIS — Z85828 Personal history of other malignant neoplasm of skin: Secondary | ICD-10-CM | POA: Diagnosis not present

## 2023-03-30 DIAGNOSIS — L905 Scar conditions and fibrosis of skin: Secondary | ICD-10-CM | POA: Diagnosis not present

## 2023-03-30 DIAGNOSIS — D225 Melanocytic nevi of trunk: Secondary | ICD-10-CM | POA: Diagnosis not present

## 2023-03-30 DIAGNOSIS — F900 Attention-deficit hyperactivity disorder, predominantly inattentive type: Secondary | ICD-10-CM | POA: Diagnosis not present

## 2023-03-30 DIAGNOSIS — F411 Generalized anxiety disorder: Secondary | ICD-10-CM | POA: Diagnosis not present

## 2023-03-30 DIAGNOSIS — F5101 Primary insomnia: Secondary | ICD-10-CM | POA: Diagnosis not present

## 2023-06-10 DIAGNOSIS — G43909 Migraine, unspecified, not intractable, without status migrainosus: Secondary | ICD-10-CM | POA: Diagnosis not present

## 2023-06-10 DIAGNOSIS — H5203 Hypermetropia, bilateral: Secondary | ICD-10-CM | POA: Diagnosis not present

## 2023-09-14 DIAGNOSIS — F411 Generalized anxiety disorder: Secondary | ICD-10-CM | POA: Diagnosis not present

## 2023-09-14 DIAGNOSIS — F900 Attention-deficit hyperactivity disorder, predominantly inattentive type: Secondary | ICD-10-CM | POA: Diagnosis not present

## 2023-09-14 DIAGNOSIS — F5101 Primary insomnia: Secondary | ICD-10-CM | POA: Diagnosis not present

## 2023-09-21 DIAGNOSIS — D225 Melanocytic nevi of trunk: Secondary | ICD-10-CM | POA: Diagnosis not present

## 2023-09-21 DIAGNOSIS — L905 Scar conditions and fibrosis of skin: Secondary | ICD-10-CM | POA: Diagnosis not present

## 2023-09-21 DIAGNOSIS — L821 Other seborrheic keratosis: Secondary | ICD-10-CM | POA: Diagnosis not present

## 2023-09-21 DIAGNOSIS — Z85828 Personal history of other malignant neoplasm of skin: Secondary | ICD-10-CM | POA: Diagnosis not present

## 2024-03-01 DIAGNOSIS — F411 Generalized anxiety disorder: Secondary | ICD-10-CM | POA: Diagnosis not present

## 2024-03-01 DIAGNOSIS — F5101 Primary insomnia: Secondary | ICD-10-CM | POA: Diagnosis not present

## 2024-03-01 DIAGNOSIS — F900 Attention-deficit hyperactivity disorder, predominantly inattentive type: Secondary | ICD-10-CM | POA: Diagnosis not present

## 2024-03-17 ENCOUNTER — Other Ambulatory Visit (HOSPITAL_COMMUNITY): Payer: Self-pay

## 2024-06-07 ENCOUNTER — Ambulatory Visit
Admission: RE | Admit: 2024-06-07 | Discharge: 2024-06-07 | Disposition: A | Source: Ambulatory Visit | Attending: Family Medicine | Admitting: Family Medicine

## 2024-06-07 ENCOUNTER — Other Ambulatory Visit: Payer: Self-pay | Admitting: Family Medicine

## 2024-06-07 DIAGNOSIS — M25562 Pain in left knee: Secondary | ICD-10-CM

## 2024-06-07 DIAGNOSIS — R1032 Left lower quadrant pain: Secondary | ICD-10-CM
